# Patient Record
Sex: Female | Born: 1995 | Race: Black or African American | Hispanic: No | Marital: Single
Health system: Midwestern US, Community
[De-identification: ages and names within clinical notes are randomized; demographics above are authoritative.]

## PROBLEM LIST (undated history)

## (undated) DIAGNOSIS — IMO0001 Reserved for inherently not codable concepts without codable children: Secondary | ICD-10-CM

## (undated) DIAGNOSIS — E669 Obesity, unspecified: Secondary | ICD-10-CM

## (undated) DIAGNOSIS — E119 Type 2 diabetes mellitus without complications: Secondary | ICD-10-CM

## (undated) DIAGNOSIS — E109 Type 1 diabetes mellitus without complications: Secondary | ICD-10-CM

## (undated) DIAGNOSIS — N920 Excessive and frequent menstruation with regular cycle: Secondary | ICD-10-CM

## (undated) HISTORY — DX: Type 2 diabetes mellitus without complications: E11.9

## (undated) HISTORY — DX: Excessive and frequent menstruation with regular cycle: N92.0

## (undated) HISTORY — PX: DENTAL SURGERY: SHX609

## (undated) HISTORY — DX: Obesity, unspecified: E66.9

## (undated) HISTORY — DX: Type 1 diabetes mellitus without complications: E10.9

---
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
## 2015-06-08 ENCOUNTER — Encounter (HOSPITAL_COMMUNITY): Payer: Self-pay | Admitting: *Deleted

## 2015-06-08 ENCOUNTER — Emergency Department (INDEPENDENT_AMBULATORY_CARE_PROVIDER_SITE_OTHER)
Admission: EM | Admit: 2015-06-08 | Discharge: 2015-06-08 | Disposition: A | Discharge status: Home / Self Care | Payer: Medicaid Other | Source: Home / Self Care | Attending: Family Medicine | Admitting: Family Medicine

## 2015-06-08 DIAGNOSIS — L309 Dermatitis, unspecified: Secondary | ICD-10-CM

## 2015-06-08 MED ORDER — TRIAMCINOLONE ACETONIDE 0.025 % EX OINT: 1.0000 "application " | TOPICAL_OINTMENT | Freq: Two times a day (BID) | CUTANEOUS | Status: AC

## 2015-06-08 NOTE — ED Notes (Signed)
Pt    reports  Both hands   Are   Dry  And  Peeling         X  3  Weeks  - no  Known  Causative   Agents      Except  Perhaps    Using a  New  Soap   detergent

## 2015-06-08 NOTE — ED Provider Notes (Signed)
CSN: [id number]     Arrival date & time 06/08/15  1513 History   First MD Initiated Contact with Patient 06/08/15 1749     Chief Complaint  Patient presents with  . Hand Problem   (Consider location/radiation/quality/duration/timing/severity/associated sxs/prior Treatment) Patient is a 19 y.o. female presenting with rash. The history is provided by the patient.  Rash Location:  Hand Hand rash location:  R palm and L palm Quality: dryness, itchiness and peeling   Severity:  Moderate Onset quality:  Gradual Progression:  Unchanged Chronicity:  New Context: new detergent/soap   Relieved by:  None tried Worsened by:  Nothing tried Ineffective treatments:  None tried Associated symptoms: no throat swelling and no tongue swelling     Past Medical History  Diagnosis Date  . Asthma   . Diabetes mellitus type I (HCC)   . Diabetes mellitus type II   . Menorrhagia with regular cycle   . Obesity    Past Surgical History  Procedure Laterality Date  . Dental surgery     Family History  Problem Relation Age of Onset  . Obesity Mother   . Hypertension Maternal Grandmother   . Cancer Maternal Grandmother   . Diabetes Maternal Grandfather   . Kidney disease Maternal Grandfather   . Obesity Maternal Grandfather   . Diabetes Paternal Grandmother   . Obesity Paternal Grandmother   . Obesity Father   . Obesity Sister   . Obesity Brother   . Diabetes Maternal Aunt   . Obesity Maternal Aunt   . Diabetes Paternal Aunt    Social History  Substance Use Topics  . Smoking status: Passive Smoke Exposure - Never Smoker  . Smokeless tobacco: None  . Alcohol Use: No   OB History    No data available     Review of Systems  Musculoskeletal: Negative.   Skin: Positive for rash.  All other systems reviewed and are negative.   Allergies  Strawberry extract  Home Medications   Prior to Admission medications   Medication Sig Start Date End Date Taking? Authorizing Provider   ACCU-CHEK FASTCLIX LANCETS MISC 1 each by Does not apply route as directed. Check sugar 6 x daily 02/07/12   Dessa Phi, MD  albuterol (PROVENTIL HFA;VENTOLIN HFA) 108 (90 BASE) MCG/ACT inhaler Inhale 2 puffs into the lungs every 6 (six) hours as needed for wheezing or shortness of breath. 08/22/11   Adlih Moreno-Coll, MD  albuterol (PROVENTIL HFA;VENTOLIN HFA) 108 (90 BASE) MCG/ACT inhaler Inhale 1-2 puffs into the lungs every 6 (six) hours as needed for wheezing. 12/01/13   Riki Sheer, PA-C  albuterol (PROVENTIL) (2.5 MG/3ML) 0.083% nebulizer solution Take 3 mLs (2.5 mg total) by nebulization every 6 (six) hours as needed for wheezing or shortness of breath. 12/01/13   Riki Sheer, PA-C  amoxicillin-clavulanate (AUGMENTIN) 875-125 MG per tablet Take 1 tablet by mouth 2 (two) times daily. One po bid x 7 days 12/14/14   Mercedes Camprubi-Soms, PA-C  azithromycin (ZITHROMAX) 250 MG tablet Take 1 tablet (250 mg total) by mouth daily. Take first 2 tablets together, then 1 every day until finished. 07/14/14   Marissa Sciacca, PA-C  beclomethasone (QVAR) 40 MCG/ACT inhaler Inhale 2 puffs into the lungs 2 (two) times daily.    Historical Provider, MD  cephALEXin (KEFLEX) 500 MG capsule Take 1 capsule (500 mg total) by mouth 4 (four) times daily. Patient not taking: Reported on 07/14/2014 05/06/13   Cathlyn Parsons, NP  fluconazole (DIFLUCAN) 150  MG tablet Take 1 tablet (150 mg total) by mouth once. Repeat in 1 week. Patient not taking: Reported on 07/14/2014 11/29/13   Linna HoffJames D Kindl, MD  glucagon 1 MG injection Use for Severe Hypoglycemia . Inject 1 mg intramuscularly if unresponsive, unable to swallow, unconscious and/or has seizure 02/07/12 02/06/13  Dessa PhiJennifer Badik, MD  ibuprofen (ADVIL,MOTRIN) 200 MG tablet Take 200 mg by mouth every 6 (six) hours as needed. 02/20/12   Historical Provider, MD  insulin aspart (NOVOLOG FLEXPEN) 100 UNIT/ML injection Up to 50 units daily as directed by MD 02/07/12 02/06/13   Dessa PhiJennifer Badik, MD  insulin aspart (NOVOLOG) 100 UNIT/ML injection Inject 5-20 Units into the skin 3 (three) times daily before meals. Sliding scale    Historical Provider, MD  insulin glargine (LANTUS SOLOSTAR) 100 UNIT/ML injection Up to 50 units per day as directed by MD 02/07/12 02/06/13  Dessa PhiJennifer Badik, MD  insulin glargine (LANTUS) 100 UNIT/ML injection Inject 40 Units into the skin at bedtime.    Historical Provider, MD  loratadine (CLARITIN) 10 MG tablet Take 10 mg by mouth daily.    Historical Provider, MD  metFORMIN (GLUCOPHAGE) 500 MG tablet Take 1 tablet (500 mg total) by mouth 2 (two) times daily with a meal. 02/07/12 02/06/13  Dessa PhiJennifer Badik, MD  metFORMIN (GLUCOPHAGE) 500 MG tablet Take 500 mg by mouth 2 (two) times daily with a meal.    Historical Provider, MD  montelukast (SINGULAIR) 10 MG tablet Take 1 tablet (10 mg total) by mouth at bedtime. 08/22/11   Adlih Moreno-Coll, MD  nystatin cream (MYCOSTATIN) Apply topically 2 (two) times daily. Patient not taking: Reported on 07/14/2014 05/06/13   Cathlyn ParsonsAngela M Kabbe, NP  predniSONE (DELTASONE) 10 MG tablet Take 2 tablets (20 mg total) by mouth daily. Patient not taking: Reported on 07/14/2014 05/06/13   Cathlyn ParsonsAngela M Kabbe, NP  predniSONE (DELTASONE) 20 MG tablet 2 tabs po once daily x 3 days Patient not taking: Reported on 07/14/2014 04/04/13   Hayden Rasmussenavid Mabe, NP  terconazole (TERAZOL 3) 80 MG vaginal suppository Place 1 suppository (80 mg total) vaginally at bedtime. Patient not taking: Reported on 07/14/2014 11/29/13   Linna HoffJames D Kindl, MD  triamcinolone (KENALOG) 0.025 % ointment Apply 1 application topically 2 (two) times daily. 06/08/15   Linna HoffJames D Kindl, MD   Meds Ordered and Administered this Visit  Medications - No data to display  BP 132/86 mmHg  Pulse 82  Temp(Src) 98 F (36.7 C) (Oral)  Resp 20  SpO2 97%  LMP 06/01/2015 No data found.   Physical Exam  Constitutional: She is oriented to person, place, and time. She appears well-developed and  well-nourished.  Musculoskeletal: Normal range of motion.  Neurological: She is alert and oriented to person, place, and time.  Skin: Rash noted.  Dry itchy symmetric palmar and plantar dermatitis bilat, no erythema or sign of infection  Nursing note and vitals reviewed.   ED Course  Procedures (including critical care time)  Labs Review Labs Reviewed - No data to display  Imaging Review No results found.   Visual Acuity Review  Right Eye Distance:   Left Eye Distance:   Bilateral Distance:    Right Eye Near:   Left Eye Near:    Bilateral Near:         MDM   1. Dermatitis, eczematoid       Linna HoffJames D Kindl, MD 06/08/15 [phone]

## 2015-06-20 ENCOUNTER — Encounter (HOSPITAL_COMMUNITY): Payer: Self-pay | Admitting: *Deleted

## 2015-06-20 ENCOUNTER — Emergency Department (HOSPITAL_COMMUNITY): Payer: Medicaid Other

## 2015-06-20 ENCOUNTER — Inpatient Hospital Stay (HOSPITAL_COMMUNITY)
Admission: EM | Admit: 2015-06-20 | Discharge: 2015-06-25 | DRG: 637 | Disposition: A | Discharge status: Home / Self Care | Payer: Medicaid Other | Attending: Family Medicine | Admitting: Family Medicine

## 2015-06-20 DIAGNOSIS — A528 Late syphilis, latent: Secondary | ICD-10-CM | POA: Diagnosis present

## 2015-06-20 DIAGNOSIS — R000 Tachycardia, unspecified: Secondary | ICD-10-CM | POA: Insufficient documentation

## 2015-06-20 DIAGNOSIS — E871 Hypo-osmolality and hyponatremia: Secondary | ICD-10-CM | POA: Diagnosis present

## 2015-06-20 DIAGNOSIS — R768 Other specified abnormal immunological findings in serum: Secondary | ICD-10-CM | POA: Diagnosis present

## 2015-06-20 DIAGNOSIS — A4151 Sepsis due to Escherichia coli [E. coli]: Secondary | ICD-10-CM | POA: Diagnosis present

## 2015-06-20 DIAGNOSIS — E869 Volume depletion, unspecified: Secondary | ICD-10-CM | POA: Diagnosis present

## 2015-06-20 DIAGNOSIS — B9620 Unspecified Escherichia coli [E. coli] as the cause of diseases classified elsewhere: Secondary | ICD-10-CM | POA: Diagnosis present

## 2015-06-20 DIAGNOSIS — E1142 Type 2 diabetes mellitus with diabetic polyneuropathy: Secondary | ICD-10-CM | POA: Diagnosis present

## 2015-06-20 DIAGNOSIS — Z1611 Resistance to penicillins: Secondary | ICD-10-CM | POA: Diagnosis present

## 2015-06-20 DIAGNOSIS — Z91048 Other nonmedicinal substance allergy status: Secondary | ICD-10-CM | POA: Diagnosis not present

## 2015-06-20 DIAGNOSIS — R7881 Bacteremia: Secondary | ICD-10-CM | POA: Diagnosis not present

## 2015-06-20 DIAGNOSIS — R0602 Shortness of breath: Secondary | ICD-10-CM | POA: Insufficient documentation

## 2015-06-20 DIAGNOSIS — E860 Dehydration: Secondary | ICD-10-CM | POA: Diagnosis present

## 2015-06-20 DIAGNOSIS — E1165 Type 2 diabetes mellitus with hyperglycemia: Secondary | ICD-10-CM | POA: Diagnosis present

## 2015-06-20 DIAGNOSIS — N179 Acute kidney failure, unspecified: Secondary | ICD-10-CM | POA: Diagnosis present

## 2015-06-20 DIAGNOSIS — R112 Nausea with vomiting, unspecified: Secondary | ICD-10-CM | POA: Diagnosis present

## 2015-06-20 DIAGNOSIS — Z794 Long term (current) use of insulin: Secondary | ICD-10-CM

## 2015-06-20 DIAGNOSIS — Z91128 Patient's intentional underdosing of medication regimen for other reason: Secondary | ICD-10-CM | POA: Diagnosis not present

## 2015-06-20 DIAGNOSIS — G4730 Sleep apnea, unspecified: Secondary | ICD-10-CM | POA: Diagnosis present

## 2015-06-20 DIAGNOSIS — Z91030 Bee allergy status: Secondary | ICD-10-CM | POA: Diagnosis not present

## 2015-06-20 DIAGNOSIS — Z833 Family history of diabetes mellitus: Secondary | ICD-10-CM

## 2015-06-20 DIAGNOSIS — Z7722 Contact with and (suspected) exposure to environmental tobacco smoke (acute) (chronic): Secondary | ICD-10-CM | POA: Diagnosis present

## 2015-06-20 DIAGNOSIS — A539 Syphilis, unspecified: Secondary | ICD-10-CM

## 2015-06-20 DIAGNOSIS — T383X6A Underdosing of insulin and oral hypoglycemic [antidiabetic] drugs, initial encounter: Secondary | ICD-10-CM | POA: Diagnosis present

## 2015-06-20 DIAGNOSIS — N12 Tubulo-interstitial nephritis, not specified as acute or chronic: Secondary | ICD-10-CM | POA: Diagnosis present

## 2015-06-20 DIAGNOSIS — R1084 Generalized abdominal pain: Secondary | ICD-10-CM | POA: Diagnosis present

## 2015-06-20 DIAGNOSIS — E1310 Other specified diabetes mellitus with ketoacidosis without coma: Secondary | ICD-10-CM

## 2015-06-20 DIAGNOSIS — R197 Diarrhea, unspecified: Secondary | ICD-10-CM | POA: Diagnosis present

## 2015-06-20 DIAGNOSIS — Z6841 Body Mass Index (BMI) 40.0 and over, adult: Secondary | ICD-10-CM

## 2015-06-20 DIAGNOSIS — Z7951 Long term (current) use of inhaled steroids: Secondary | ICD-10-CM

## 2015-06-20 DIAGNOSIS — R079 Chest pain, unspecified: Secondary | ICD-10-CM | POA: Diagnosis not present

## 2015-06-20 DIAGNOSIS — J4540 Moderate persistent asthma, uncomplicated: Secondary | ICD-10-CM | POA: Diagnosis present

## 2015-06-20 DIAGNOSIS — Z9114 Patient's other noncompliance with medication regimen: Secondary | ICD-10-CM

## 2015-06-20 DIAGNOSIS — I471 Supraventricular tachycardia: Secondary | ICD-10-CM | POA: Diagnosis present

## 2015-06-20 DIAGNOSIS — IMO0002 Reserved for concepts with insufficient information to code with codable children: Secondary | ICD-10-CM | POA: Diagnosis present

## 2015-06-20 DIAGNOSIS — D649 Anemia, unspecified: Secondary | ICD-10-CM | POA: Diagnosis present

## 2015-06-20 DIAGNOSIS — E1010 Type 1 diabetes mellitus with ketoacidosis without coma: Secondary | ICD-10-CM | POA: Diagnosis not present

## 2015-06-20 DIAGNOSIS — E1110 Type 2 diabetes mellitus with ketoacidosis without coma: Secondary | ICD-10-CM | POA: Diagnosis present

## 2015-06-20 DIAGNOSIS — E1169 Type 2 diabetes mellitus with other specified complication: Principal | ICD-10-CM | POA: Diagnosis present

## 2015-06-20 LAB — BETA-HYDROXYBUTYRIC ACID: BETA-HYDROXYBUTYRIC ACID: 5.06 mmol/L — AB (ref 0.05–0.27)

## 2015-06-20 LAB — COMPREHENSIVE METABOLIC PANEL
ALK PHOS: 101 U/L (ref 38–126)
ALT: 15 U/L (ref 14–54)
ANION GAP: 17 — AB (ref 5–15)
AST: 20 U/L (ref 15–41)
Albumin: 2.9 g/dL — ABNORMAL LOW (ref 3.5–5.0)
BUN: 18 mg/dL (ref 6–20)
CO2: 16 mmol/L — ABNORMAL LOW (ref 22–32)
Calcium: 9 mg/dL (ref 8.9–10.3)
Chloride: 89 mmol/L — ABNORMAL LOW (ref 101–111)
Creatinine, Ser: 1.54 mg/dL — ABNORMAL HIGH (ref 0.44–1.00)
GFR calc non Af Amer: 48 mL/min — ABNORMAL LOW (ref >60)
GFR, EST AFRICAN AMERICAN: 56 mL/min — AB (ref >60)
Glucose, Bld: 571 mg/dL (ref 65–99)
Potassium: 5.1 mmol/L (ref 3.5–5.1)
Sodium: 122 mmol/L — ABNORMAL LOW (ref 135–145)
TOTAL PROTEIN: 8.4 g/dL — AB (ref 6.5–8.1)
Total Bilirubin: 1.2 mg/dL (ref 0.3–1.2)

## 2015-06-20 LAB — URINALYSIS, ROUTINE W REFLEX MICROSCOPIC
Glucose, UA: >1000 mg/dL — AB
LEUKOCYTES UA: NEGATIVE
Nitrite: NEGATIVE
PROTEIN: 100 mg/dL — AB
Specific Gravity, Urine: 1.033 — ABNORMAL HIGH (ref 1.005–1.030)
Urobilinogen, UA: 1 mg/dL (ref 0.0–1.0)
pH: 5 (ref 5.0–8.0)

## 2015-06-20 LAB — BASIC METABOLIC PANEL
ANION GAP: 10 (ref 5–15)
BUN: 14 mg/dL (ref 6–20)
CHLORIDE: 101 mmol/L (ref 101–111)
CO2: 19 mmol/L — ABNORMAL LOW (ref 22–32)
Calcium: 7.8 mg/dL — ABNORMAL LOW (ref 8.9–10.3)
Creatinine, Ser: 1.07 mg/dL — ABNORMAL HIGH (ref 0.44–1.00)
GFR calc non Af Amer: >60 mL/min (ref >60)
Glucose, Bld: 301 mg/dL — ABNORMAL HIGH (ref 65–99)
POTASSIUM: 4.3 mmol/L (ref 3.5–5.1)
SODIUM: 130 mmol/L — AB (ref 135–145)

## 2015-06-20 LAB — URINE MICROSCOPIC-ADD ON

## 2015-06-20 LAB — I-STAT VENOUS BLOOD GAS, ED
Acid-base deficit: 11 mmol/L — ABNORMAL HIGH (ref 0.0–2.0)
Bicarbonate: 17 mEq/L — ABNORMAL LOW (ref 20.0–24.0)
O2 SAT: 51 %
PCO2 VEN: 45.6 mmHg (ref 45.0–50.0)
PO2 VEN: 34 mmHg (ref 30.0–45.0)
TCO2: 18 mmol/L (ref 0–100)
pH, Ven: 7.179 — CL (ref 7.250–7.300)

## 2015-06-20 LAB — I-STAT BETA HCG BLOOD, ED (MC, WL, AP ONLY)

## 2015-06-20 LAB — CBC
HCT: 37.3 % (ref 36.0–46.0)
HEMOGLOBIN: 12.1 g/dL (ref 12.0–15.0)
MCH: 24.6 pg — ABNORMAL LOW (ref 26.0–34.0)
MCHC: 32.4 g/dL (ref 30.0–36.0)
MCV: 76 fL — ABNORMAL LOW (ref 78.0–100.0)
PLATELETS: 274 10*3/uL (ref 150–400)
RBC: 4.91 MIL/uL (ref 3.87–5.11)
RDW: 13.4 % (ref 11.5–15.5)
WBC: 17.3 10*3/uL — ABNORMAL HIGH (ref 4.0–10.5)

## 2015-06-20 LAB — LIPASE, BLOOD: Lipase: 41 U/L (ref 11–51)

## 2015-06-20 LAB — CBG MONITORING, ED
Glucose-Capillary: 475 mg/dL — ABNORMAL HIGH (ref 65–99)
Glucose-Capillary: 457 mg/dL — ABNORMAL HIGH (ref 65–99)
Glucose-Capillary: 403 mg/dL — ABNORMAL HIGH (ref 65–99)
Glucose-Capillary: 342 mg/dL — ABNORMAL HIGH (ref 65–99)

## 2015-06-20 LAB — PREGNANCY, URINE: Preg Test, Ur: NEGATIVE

## 2015-06-20 LAB — GLUCOSE, CAPILLARY: GLUCOSE-CAPILLARY: 245 mg/dL — AB (ref 65–99)

## 2015-06-20 MED ORDER — DILTIAZEM LOAD VIA INFUSION
10.0000 mg | Freq: Once | INTRAVENOUS | Status: AC
  Administered 2015-06-20: 10 mg via INTRAVENOUS
  Filled 2015-06-20: qty 10

## 2015-06-20 MED ORDER — DILTIAZEM HCL 100 MG IV SOLR
5.0000 mg/h | INTRAVENOUS | Status: AC
  Administered 2015-06-20: 5 mg/h via INTRAVENOUS

## 2015-06-20 MED ORDER — BUDESONIDE 0.25 MG/2ML IN SUSP
0.2500 mg | Freq: Two times a day (BID) | RESPIRATORY_TRACT | Status: DC
  Administered 2015-06-21 – 2015-06-25 (×9): 0.25 mg via RESPIRATORY_TRACT
  Filled 2015-06-20 (×10): qty 2

## 2015-06-20 MED ORDER — ONDANSETRON HCL 4 MG PO TABS
4.0000 mg | ORAL_TABLET | Freq: Four times a day (QID) | ORAL | Status: DC | PRN
Start: 2015-06-20 — End: 2015-06-22
  Administered 2015-06-21 – 2015-06-22 (×2): 4 mg via ORAL
  Filled 2015-06-20 (×2): qty 1

## 2015-06-20 MED ORDER — ALBUTEROL SULFATE (2.5 MG/3ML) 0.083% IN NEBU
2.5000 mg | INHALATION_SOLUTION | RESPIRATORY_TRACT | Status: DC | PRN
  Administered 2015-06-22 (×2): 2.5 mg via RESPIRATORY_TRACT
  Filled 2015-06-20 (×3): qty 3

## 2015-06-20 MED ORDER — ACETAMINOPHEN 325 MG PO TABS
650.0000 mg | ORAL_TABLET | Freq: Once | ORAL | Status: AC
  Administered 2015-06-20: 650 mg via ORAL
  Filled 2015-06-20: qty 2

## 2015-06-20 MED ORDER — DEXTROSE-NACL 5-0.45 % IV SOLN
INTRAVENOUS | Status: DC
  Administered 2015-06-20 – 2015-06-21 (×2): via INTRAVENOUS

## 2015-06-20 MED ORDER — ADENOSINE 6 MG/2ML IV SOLN
6.0000 mg | Freq: Once | INTRAVENOUS | Status: DC
  Filled 2015-06-20: qty 2

## 2015-06-20 MED ORDER — DILTIAZEM HCL 100 MG IV SOLR
INTRAVENOUS | Status: AC
  Filled 2015-06-20: qty 100

## 2015-06-20 MED ORDER — SODIUM CHLORIDE 0.9 % IV BOLUS (SEPSIS)
1000.0000 mL | Freq: Once | INTRAVENOUS | Status: AC
  Administered 2015-06-20: 1000 mL via INTRAVENOUS

## 2015-06-20 MED ORDER — ENOXAPARIN SODIUM 40 MG/0.4ML ~~LOC~~ SOLN
40.0000 mg | SUBCUTANEOUS | Status: DC
  Administered 2015-06-21 – 2015-06-25 (×5): 40 mg via SUBCUTANEOUS
  Filled 2015-06-20 (×6): qty 0.4

## 2015-06-20 MED ORDER — ONDANSETRON 4 MG PO TBDP
ORAL_TABLET | ORAL | Status: AC
  Filled 2015-06-20: qty 1

## 2015-06-20 MED ORDER — POTASSIUM CHLORIDE 10 MEQ/100ML IV SOLN
10.0000 meq | INTRAVENOUS | Status: AC
  Administered 2015-06-20 – 2015-06-21 (×2): 10 meq via INTRAVENOUS
  Filled 2015-06-20 (×2): qty 100

## 2015-06-20 MED ORDER — SODIUM CHLORIDE 0.9 % IV SOLN
INTRAVENOUS | Status: AC
Start: 2015-06-20 — End: 2015-06-21
  Administered 2015-06-20: 22:00:00 via INTRAVENOUS

## 2015-06-20 MED ORDER — ACETAMINOPHEN 325 MG PO TABS
650.0000 mg | ORAL_TABLET | Freq: Four times a day (QID) | ORAL | Status: DC | PRN
  Administered 2015-06-20 – 2015-06-23 (×8): 650 mg via ORAL
  Filled 2015-06-20 (×8): qty 2

## 2015-06-20 MED ORDER — ONDANSETRON HCL 4 MG/2ML IJ SOLN
4.0000 mg | Freq: Three times a day (TID) | INTRAMUSCULAR | Status: DC | PRN
  Filled 2015-06-20: qty 2

## 2015-06-20 MED ORDER — SODIUM CHLORIDE 0.9 % IV SOLN
INTRAVENOUS | Status: DC
  Administered 2015-06-20 – 2015-06-24 (×6): via INTRAVENOUS

## 2015-06-20 MED ORDER — MONTELUKAST SODIUM 10 MG PO TABS
10.0000 mg | ORAL_TABLET | Freq: Every day | ORAL | Status: DC
  Administered 2015-06-20 – 2015-06-24 (×4): 10 mg via ORAL
  Filled 2015-06-20 (×4): qty 1

## 2015-06-20 MED ORDER — ONDANSETRON 4 MG PO TBDP
4.0000 mg | ORAL_TABLET | Freq: Once | ORAL | Status: AC | PRN
  Administered 2015-06-20: 4 mg via ORAL

## 2015-06-20 MED ORDER — SODIUM CHLORIDE 0.9 % IV SOLN
INTRAVENOUS | Status: DC
  Administered 2015-06-20: 4 [IU]/h via INTRAVENOUS
  Filled 2015-06-20: qty 2.5

## 2015-06-20 MED ORDER — SODIUM CHLORIDE 0.9 % IV SOLN
1000.0000 mL | Freq: Once | INTRAVENOUS | Status: AC
  Administered 2015-06-20: 1000 mL via INTRAVENOUS

## 2015-06-20 MED ORDER — BECLOMETHASONE DIPROPIONATE 40 MCG/ACT IN AERS: 2.0000 | INHALATION_SPRAY | Freq: Two times a day (BID) | RESPIRATORY_TRACT | Status: DC

## 2015-06-20 MED ORDER — MORPHINE SULFATE (PF) 2 MG/ML IV SOLN
2.0000 mg | INTRAVENOUS | Status: DC | PRN
  Filled 2015-06-20: qty 1

## 2015-06-20 NOTE — Discharge Summary (Signed)
Family Medicine Teaching [facility] Discharge Summary  Patient name: Brandi Robertson Medical record number: [id number] Date of birth: 1996/01/01 Age: 19 y.o. Gender: female Date of Admission: 06/20/2015  Date of Discharge: 06/25/15 Admitting Physician: Doreene Eland, MD  Primary Care Provider: [facility] Consultants: Cardiology  Indication for Hospitalization: DKA  Discharge Diagnoses/Problem List:  Principal Problem:   DKA, mild-moderate, in setting of uncontrolled insulin dependent Type 2 DM) Active Problems:   Type 2 diabetes mellitus, uncontrolled (HCC)   Morbid obesity (HCC)   Diabetic peripheral neuropathy (HCC)   Nausea vomiting and diarrhea   Generalized abdominal pain   Asthma, moderate persistent   Bacteremia   Sinus tachycardia (HCC)   SOB (shortness of breath)   Syphilis   AKI, presumed pre-renal from mod-dehydration, secondary to DKA  Disposition: Home  Discharge Condition: Stable  Discharge Exam:  Blood pressure 134/88, pulse 78, temperature 98.4 F (36.9 C), temperature source Oral, resp. rate 21, height  (1.626 m), weight 248 lb 4.8 oz (112.628 kg), last menstrual period 06/01/2015, SpO2 100 %.  General: Obese, well-appearing female laying in bed Cardiac: RRR, S1, S2, no m/r/g Pulm: CTAB, no wheezes Abdomen: slight ttp of left lower quadrant Skin: multiple hypopigmented lesions and dry scaling on soles of feet and palms of hands  Brief Hospital Course:   Brandi Robertson is a 19 y.o. Female (PMH T2DM (insulin dependent), peripheral neuropathy DM, morbid obesity) who presented to ED on 11/12 with nausea / vomiting, generalized abdominal pain for 2 days found to be in mild-moderate DKA. Suspected DKA was triggered from non-adherence of all insulin for >1 year, lost to follow-up from Endocrine >2 years. No recent illness or history to suggest infectious trigger, seems symptoms acute onset with general GI complaints with  DKA.  On arrival to ED patient with stable mental status, found to have CBG to 550s, AG 17, bicarb 16. ED initiated DKA protocol with IVF resuscitation, started on insulin drip. On admission, patient was hemodynamically stable, without active vomiting and tolerating small sips PO. She remained tachycardic in 130s-140s, low-grade fever to 101F. Further work-up without identified infectious etiology, cultures collected.  Patient was admitted to SDU, and after arrival to floor, patient proceeded to develop significant sinus tachycardia up to HR 180s, this was sustained for at least an hour, with some complaint of chest tightness. Subsequent EKG suggested sinus tach and not SVT. Trial of diltiazem IV drip and bolus started with only mild improvement. Troponins negative. Cardiology fellow on-call suspected 19 yr old stress response with DKA / fever could have HR up to 180, recommended Metoprolol IV PRN, with improvement. Metoprolol continued PO for 3 days. Patient's tachycardia had resolved on day of discharge off of metoprolol.   During hospitalization, patient's anion gap closed over first 6 hours, but continued insulin drip to ensure it remained stable and clinical improvement until the following day. Transitioned to SQ insulin with Lantus 25 and SSI. Lantus up-titrated over hospitalization to 53 units daily.   Initial blood culture resulted as growing E. coli sensitive to cephalosporins but penicillin resistant. Bacteremia suspected to be due to pyelonephritis given growth of gram negative rods. Patient was treated with IV zosyn x 3 days then received 2 doses of ancef, then switched to Keflex 500 mg q6h x 7 days.  HIV testing was negative, but patient was positive for syphilis by both quant and reflex antibody testing. Separate fluorescent treponemal IgG antibody testing also positive. The health department  interviewed patient in the hospital. Patient received an injection of IM penicillin g benzathine 2.4  million units on 06/24/15. 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 2nd dose scheduled for 07/01/15 at the health department.   Patient's diabetes uncontrolled with a HgbA1c of 12.9 and no treatment with insulin for years. Simplified hospital regimen upon discharge to 53 units of lantus daily without SSI and restarting metformin 500 mg BID.   Patient's mother agreed to try to help patient keep up with her insulin and complete antibiotics course for suspected pyelonephritis and known syphilis.   Issues for Follow Up:  1. 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  2. Confirm that patient has been able to get glucometer. 3. Assess control of blood sugars on lantus 53 units and metformin 500 mg BID.  4. Consider adding once-a-week GLP-1 agonist like tanzeum to regimen, if patient able to adhere to current insulin regimen, for better control.  5. Recheck CBC for anemia, hbg 9.3 on discharge. Appears to be microcytic, iron-deficiency anemia with low iron of 10. However, TIBC was low and ferritin high. Reticulocytes only 0.6%. Folate wnl, B12 high.   Significant Procedures: none  Significant Labs and Imaging:   Recent Labs Lab 06/23/15 1620 06/24/15 0324 06/25/15 1455  WBC 10.3 11.1* 10.5  HGB 8.5* 9.2* 9.3*  HCT 26.0* 27.7* 27.6*  PLT 222 240 271    Recent Labs Lab 06/20/15 1513  06/21/15  06/21/15 1230 06/22/15 0455 06/23/15 1620 06/24/15 0324 06/25/15 1117  NA 122*  < > 128*  < > 129* 129* 134* 139 145  K 5.1  < > 4.3  < > 3.9 3.7 3.2* 3.5 4.4  CL 89*  < > 99*  < > 105 104 110 112* 114*  CO2 16*  < > 17*  < > 17* 17* 19* 20* 20*  GLUCOSE 571*  < > 299*  < > 138* 309* 196* 224* 189*  BUN 18  < > 14  < > <5*  CREATININE 1.54*  < > 1.09*  < > 0.97 1.04* 0.89 0.83 0.70  CALCIUM 9.0  < > 7.9*  < > 7.4* 7.4* 7.0* 7.3* 8.4*  MG  --   --  2.0  --   --    --   --   --   --   ALKPHOS 101  --   --   --   --   --   --   --   --   AST 20  --   --   --   --   --   --   --   --   ALT 15  --   --   --   --   --   --   --   --   ALBUMIN 2.9*  --   --   --   --   --   --   --   --   < > = values in this interval not displayed.  Beta-hydroxybutyric acid - 5.06 (high)  VBG pH 7.179 (low), pCO2 45.6, pO2 34, bicarb 17  Lipase 41  UA - cloudy, rare bacteria, moderate bili, granular cast, glucose >1000, large hgb, >80 ketones, neg leuks, neg nitrite, protein 100, RBC 11-20, Spec grav 1.033 (elevated), WBC 7-10  Urine Pregnancy - negative  Urine Culture - multiple  species present  Blood Cultures x 2 - (11/12>>) positive for E. coli; repeat culture drawn 11/13 showed no growth x 5 days  06/22/15 - RPR, quant: 1:128 (H) 06/22/15 - T pallidum Abs: Positive 06/23/15 - Fluorescent treponemal Ab, IgG: Reactive  11/12 CXR Portable 1v FINDINGS: Heart size and mediastinal contours are within normal limits. Both lungs are clear. Visualized skeletal structures are unremarkable. IMPRESSION: Negative exam.  11/12 ED EKG Sinus tachycardia with HR 147, with some repolarization in inferior leads. No evidence of acute ST-T wave ischemic changes.   Results/Tests Pending at Time of Discharge: None  Discharge Medications:    Medication List    STOP taking these medications        insulin aspart 100 UNIT/ML injection  Commonly known as:  NOVOLOG FLEXPEN      TAKE these medications        accu-chek multiclix lancets  Use as instructed     albuterol 108 (90 BASE) MCG/ACT inhaler  Commonly known as:  PROVENTIL HFA;VENTOLIN HFA  Inhale 2 puffs into the lungs every 6 (six) hours as needed for wheezing or shortness of breath.     albuterol (2.5 MG/3ML) 0.083% nebulizer solution  Commonly known as:  PROVENTIL  Take 3 mLs (2.5 mg total) by nebulization every 6 (six) hours as needed for wheezing or shortness of breath.     beclomethasone 40 MCG/ACT  inhaler  Commonly known as:  QVAR  Inhale 2 puffs into the lungs 2 (two) times daily.     cephALEXin 500 MG capsule  Commonly known as:  KEFLEX  Take 1 capsule (500 mg total) by mouth every 6 (six) hours.     glucagon 1 MG injection  Use for Severe Hypoglycemia . Inject 1 mg intramuscularly if unresponsive, unable to swallow, unconscious and/or has seizure     ibuprofen 200 MG tablet  Commonly known as:  ADVIL,MOTRIN  Take 200 mg by mouth every 6 (six) hours as needed.     insulin glargine 100 UNIT/ML injection  Commonly known as:  LANTUS  Inject 0.53 mLs (53 Units total) into the skin daily.     loratadine 10 MG tablet  Commonly known as:  CLARITIN  Take 10 mg by mouth daily.     metFORMIN 500 MG tablet  Commonly known as:  GLUCOPHAGE  Take 1 tablet (500 mg total) by mouth 2 (two) times daily with a meal.     montelukast 10 MG tablet  Commonly known as:  SINGULAIR  Take 1 tablet (10 mg total) by mouth at bedtime.     triamcinolone 0.025 % ointment  Commonly known as:  KENALOG  Apply 1 application topically 2 (two) times daily.        Discharge Instructions: Please refer to Patient Instructions section of EMR for full details.  Patient was counseled important signs and symptoms that should prompt return to medical care, changes in medications, dietary instructions, activity restrictions, and follow up appointments.   Follow-Up Appointments: Follow-up Information    Follow up with Family Medicine. Go on 06/29/2015.   Why:   2:15 pm hospital follow-up appointment    Contact information:   [address] [phone]       Follow up with [facility]. Go on 07/01/2015.   Why:  10:00 am appointment for penicillin shot to treat syphilis. Please call [phone] if you need to reschedule.   Contact information:   [address] [place] [address] [phone]  Casey BurkittHillary Moen Fitzgerald, MD 06/25/2015, 4:51 PM PGY-1, [facility]

## 2015-06-20 NOTE — ED Notes (Signed)
Pt reports generalized abd pain x 2 days with n/v/d, headache and back pain.

## 2015-06-20 NOTE — ED Notes (Signed)
Admitting at bedside, 2nd RN unable to obtain access, MD aware will consult IV team.

## 2015-06-20 NOTE — ED Notes (Signed)
Unable to obtain 2nd IV, 2nd RN to attempt.

## 2015-06-20 NOTE — H&P (Signed)
Brandi Robertson: [phone]  Patient name: Brandi Robertson Medical record number: [id number] Date of birth: [birth year] Age: 19 y.o. Gender: female  Primary Care Provider: Lenoard Aden, MD Consultants: none Code Status: Full  Chief Complaint: Nausea / Vomiting, Abdominal Pain  Assessment and Plan: Brandi Robertson is a 19 y.o. female presenting with nausea / vomiting, generalized abdominal pain for 2 days found to be in mild-moderate DKA with uncontrolled Insulin Dependent Diabetes. PMH is significant for T2DM (insulin dependent), peripheral neuropathy DM, morbid obesity, mod persistent asthma.  # DKA, mild-moderate, in setting of uncontrolled insulin dependent Type 2 Diabetes Clinically consistent with mild-mod DKA as she is dehydrated on admit with hyperglycemia to 550s, AG 17, low pH VBG, bicarb 16, significant >80 ketones in urine, and positive beta-hydroxybutyric acid. Suspected trigger is non-adherence to insulin therapy (>1 year off lantus, novolog) and no longer following endocrinology (Dr. Tobe Robertson, last 09/2012), chart review shows patient is a T2DM as she has prior c-peptide positive but unable to meet body req of insulin, therefore not Type 1 DM. Possible underlying viral gastroenteritis as contributing trigger, with some nausea, vomiting, diarrhea, abdominal pain, however without sick contacts, low-grade temp in ED but none previous, also elevated WBC 17. On admit clinically well-appearing, normal mental status, and hemodynamically stable. - Admit to SDU, anticipate if AG closed and transitioned to SQ insulin in morning can transfer out of SDU - Continue on Insulin Drip protocol for DKA - S/p 1L NS IV bolus in ED - Continue increased MIVF with NS @ 175cc/hr while CBG >250, then transition to D5 per protocol - Ordered IV K 79mq x 2 runs for K at 5.1, anticipate to decrease with insulin - BMET q 4 hr  overnight, follow AG - Follow AG until closed x 2 and symptoms improved prior to start transition to SQ insulin (on home Lantus 40, would likely start with 20 to 25u first dose, titrate up), keep insulin drip on for 2 hours after SQ Lantus given then discontinue. - Check A1c - Zofran PRN nausea - NPO >> clear liquid diet as tolerated - UA without significant sign of infection, urine culture pending - Blood cultures x 2 pending - Urine pregnancy negative  # AKI, likely pre-renal from mod dehydration, secondary to DKA Clinically dehydrated secondary to above DKA. Elevated SCr to 1.54 from baseline 0.5-0.6. Does take some ibuprofen at home, last took yesterday. - Treat underlying DKA - IVF resuscitation - Anticipate start clear liq diet as soon as tolerated - Hold nephrotoxic meds - (holding metformin, avoid NSAIDs)  # Morbid Obesity Currently 240 lb on admit with BMI 41. T2DM with severe insulin resistance. - Check lipid panel in AM - Check A1c  # Asthma, mod-persistent - without exacerbation Currentle stable. No wheezing. Stable respiratory status. No recent exacerbations. - ordered albuterol neb PRN - resume home singulair, qvar  FEN/GI: NS IVF per protocol and transition to D5 IVF, NPO until adv to diet Prophylaxis: Lovenox SQ  Disposition: Admit to SDU for DKA, on insulin drip protocol and IVF rehydration, close monitoring until AG closed then transition to SQ insulin when tolerating PO. Anticipate 1-3 day hospitalization.  History of Present Illness:  Brandi Escarenois a 19y.o. female presenting with nausea, vomiting, abdominal pain.  She reports symptoms started to gradually worsen about 2 days ago with nausea, vomiting (worse after eating or drinking anything with "flavor" makes nausea worse), generalized abdominal and back pain,  severity of pain is 19/10 and constant, without improvement, tried OTC Ibuprofen without significant relief. - Admits to occasional cough. Denies  any recent sick contacts. - Admits to diarrhea for past 2 days, started after nausea/vomiting - Denies any associated fevers/chills, chest pain, shortness of breath, dysuria, hematuria  Reports history of Diabetes. Takes Lantus 40 units in morning and Novolog SSI about 4 times daily, also Metformin 527m BID. Previously followed by Dr. BTobe Soslast OBovey4/2015. States that she ran out of her insulin and metformin about 1 year ago. Expired glucagon kit at home.  History asthma and allergies, claritin, singulair, qvar 447m 2 puffs BID, albuterol inhaler. Denies any recent history of asthma exacerbation.  Lives at home locally with mother, sister and brother.  Note - mother not in ED with patient, she was accompanied by her sister. I called mother per patient request to provide update on her current hospitalization.  Review Of Systems: Per HPI with the following additions: see above Otherwise the remainder of the systems were negative.  Patient Active Problem List   Diagnosis Date Noted  . Other malaise and fatigue 10/04/2012  . Polyuria 10/04/2012  . Diabetic peripheral neuropathy ([facility]02/27/2014  . Type 2 diabetes mellitus, uncontrolled ([facility] [place]07/09/2011  . Morbid obesity ([facility]07/09/2011  . Menorrhagia with irregular cycle 02/07/2012  . Acanthosis 02/07/2012  . Menorrhagia with regular cycle     Past Medical History: Past Medical History  Diagnosis Date  . Asthma   . Diabetes mellitus type I ([facility]  . Diabetes mellitus type II   . Menorrhagia with regular cycle   . Obesity     Past Surgical History: Past Surgical History  Procedure Laterality Date  . Dental surgery      Social History: Social History  Substance Use Topics  . Smoking status: Passive Smoke Exposure - Never Smoker  . Smokeless tobacco: None  . Alcohol Use: No   Additional social history:  Lives at home with Mother (primary caregiver), older sister, and brother. Not employed. Did not graduate high school,  completed 11th grade. Denies any use of tobacco, alcohol, or illicit drugs.   Please also refer to relevant sections of EMR.  Family History: Family History  Problem Relation Age of Onset  . Obesity Mother   . Hypertension Maternal Grandmother   . Cancer Maternal Grandmother   . Diabetes Maternal Grandfather   . Kidney disease Maternal Grandfather   . Obesity Maternal Grandfather   . Diabetes Paternal Grandmother   . Obesity Paternal Grandmother   . Obesity Father   . Obesity Sister   . Obesity Brother   . Diabetes Maternal Aunt   . Obesity Maternal Aunt   . Diabetes Paternal Aunt     Allergies and Medications: Allergies  Allergen Reactions  . Bee Venom   . Strawberry Extract Hives   No current facility-administered medications on file prior to encounter.   Current Outpatient Prescriptions on File Prior to Encounter  Medication Sig Dispense Refill  . ACCU-CHEK FASTCLIX LANCETS MISC 1 each by Does not apply route as directed. Check sugar 6 x daily 204 each 3  . albuterol (PROVENTIL HFA;VENTOLIN HFA) 108 (90 BASE) MCG/ACT inhaler Inhale 2 puffs into the lungs every 6 (six) hours as needed for wheezing or shortness of breath. 1 Inhaler 0  . albuterol (PROVENTIL HFA;VENTOLIN HFA) 108 (90 BASE) MCG/ACT inhaler Inhale 1-2 puffs into the lungs every 6 (six) hours as needed for wheezing. 1 Inhaler 0  .  albuterol (PROVENTIL) (2.5 MG/3ML) 0.083% nebulizer solution Take 3 mLs (2.5 mg total) by nebulization every 6 (six) hours as needed for wheezing or shortness of breath. 75 mL 12  . amoxicillin-clavulanate (AUGMENTIN) 875-125 MG per tablet Take 1 tablet by mouth 2 (two) times daily. One po bid x 7 days 14 tablet 0  . azithromycin (ZITHROMAX) 250 MG tablet Take 1 tablet (250 mg total) by mouth daily. Take first 2 tablets together, then 1 every day until finished. 6 tablet 0  . beclomethasone (QVAR) 40 MCG/ACT inhaler Inhale 2 puffs into the lungs 2 (two) times daily.    . cephALEXin  (KEFLEX) 500 MG capsule Take 1 capsule (500 mg total) by mouth 4 (four) times daily. (Patient not taking: Reported on 07/14/2014) 28 capsule 0  . fluconazole (DIFLUCAN) 150 MG tablet Take 1 tablet (150 mg total) by mouth once. Repeat in 1 week. (Patient not taking: Reported on 07/14/2014) 1 tablet 1  . glucagon 1 MG injection Use for Severe Hypoglycemia . Inject 1 mg intramuscularly if unresponsive, unable to swallow, unconscious and/or has seizure 2 each 3  . ibuprofen (ADVIL,MOTRIN) 200 MG tablet Take 200 mg by mouth every 6 (six) hours as needed.    . insulin aspart (NOVOLOG FLEXPEN) 100 UNIT/ML injection Up to 50 units daily as directed by MD 5 pen 3  . insulin aspart (NOVOLOG) 100 UNIT/ML injection Inject 5-20 Units into the skin 3 (three) times daily before meals. Sliding scale    . insulin glargine (LANTUS SOLOSTAR) 100 UNIT/ML injection Up to 50 units per day as directed by MD 15 mL 3  . insulin glargine (LANTUS) 100 UNIT/ML injection Inject 40 Units into the skin at bedtime.    Marland Kitchen loratadine (CLARITIN) 10 MG tablet Take 10 mg by mouth daily.    . metFORMIN (GLUCOPHAGE) 500 MG tablet Take 1 tablet (500 mg total) by mouth 2 (two) times daily with a meal. 60 tablet 11  . metFORMIN (GLUCOPHAGE) 500 MG tablet Take 500 mg by mouth 2 (two) times daily with a meal.    . montelukast (SINGULAIR) 10 MG tablet Take 1 tablet (10 mg total) by mouth at bedtime. 30 tablet 0  . nystatin cream (MYCOSTATIN) Apply topically 2 (two) times daily. (Patient not taking: Reported on 07/14/2014) 30 g 0  . predniSONE (DELTASONE) 10 MG tablet Take 2 tablets (20 mg total) by mouth daily. (Patient not taking: Reported on 07/14/2014) 8 tablet 0  . predniSONE (DELTASONE) 20 MG tablet 2 tabs po once daily x 3 days (Patient not taking: Reported on 07/14/2014) 6 tablet 0  . terconazole (TERAZOL 3) 80 MG vaginal suppository Place 1 suppository (80 mg total) vaginally at bedtime. (Patient not taking: Reported on 07/14/2014) 3  suppository 0  . triamcinolone (KENALOG) 0.025 % ointment Apply 1 application topically 2 (two) times daily. 30 g 2    Objective: BP 112/59 mmHg  Pulse 149  Temp(Src) 99.4 F (37.4 C) (Oral)  Resp 23  Ht _0  (1.626 m)  Wt 240 lb 4 oz (108.977 kg)  BMI 41.22 kg/m2  SpO2 97%  LMP 06/01/2015 Exam: General: Morbidly obese, laying in bed, pleasant, cooperative, NAD Eyes: Clear conjunctiva, anicteric sclera, PERRL, EOMI ENTM: nares patent, oropharynx clear, tongue dry with thickened film mucus membranes dry, poor dentition Neck: large, supple, non-tender, full AROM Chest wall - non-tender to palpation Cardiovascular: tachycardic (improved on exam), regular rhythm, no murmurs heard Respiratory: Diminished breath sounds bilaterally due to body habitus and effort,  but mostly CTAB. No focal crackles, or wheezing heard. Good air movement. Speaks full sentences. No tachypnea. Abdomen: Obese, soft, mild generalized tenderness, seems to localize to epigastric and lower abdomen, no rebound or guarding, negative McBurney's MSK: Back no obvious deformity, tenderness to palpation bilateral paraspinal mid to lower lumbar spine with mild muscle spasm, difficult to exam back muscles due to obesity. Distal muscle str 5/5, moves all ext. Skin: warm, dry, notable thickened dry cracking skin on palms R > L, no ulcerations, or rashes Neuro: Awake, alert, oriented (person, place, year), grossly non-focal, CN-II XII grossly intact, mild reduced distal sensation to light touch on Right fingertips only, otherwise distal sensation intact. Psych: Normal speech and thoughts. Some insight into condition but poor medical comprehension.  Labs and Imaging: CBC BMET   Recent Labs Lab 06/20/15 1513  WBC 17.3*  HGB 12.1  HCT 37.3  PLT 274    Recent Labs Lab 06/20/15 1513  NA 122*  K 5.1  CL 89*  CO2 16*  BUN 18  CREATININE 1.54*  GLUCOSE 571*  CALCIUM 9.0     Beta-hydroxybutyric acid - 5.06  (high)  VBG pH 7.179 (low), pCO2 45.6, pO2 34, bicarb 17  Lipase 41  UA - cloudy, rare bacteria, moderate bili, granular cast, glucose >1000, large hgb, >80 ketones, neg leuks, neg nitrite, protein 100, RBC 11-20, Spec grav 1.033 (elevated), WBC 7-10  Urine Pregnancy - negative  Urine Culture - pending  Blood Cultures x 2 - (11/12>>) pending  11/12 CXR Portable 1v FINDINGS: Heart size and mediastinal contours are within normal limits. Both lungs are clear. Visualized skeletal structures are unremarkable. IMPRESSION: Negative exam.  11/12 ED EKG Sinus tachycardia with HR 147, with some repolarization in inferior leads. No evidence of acute ST-T wave ischemic changes.   Olin Hauser, DO 06/20/2015, 5:50 PM PGY-3, [place] Intern Robertson: [phone], text pages welcome

## 2015-06-20 NOTE — ED Provider Notes (Addendum)
CSN: [id number]     Arrival date & time 06/20/15  1426 History   First MD Initiated Contact with Patient 06/20/15 1636     Chief Complaint  Patient presents with  . Abdominal Pain  . Emesis     (Consider location/radiation/quality/duration/timing/severity/associated sxs/prior Treatment) HPI This is a 19 year old insulin-dependent diabetic who comes in today stating that she has not been taking her insulin for the past year. She has had nausea, vomiting, and diarrhea for the past 2 days. Eyes any fever or chills. She states that she has had some greenish colored vomitus. She denies any blood in her emesis or in her stool. Review of her records revealed both type I and type 2 diabetes with morbid obesity. The patient is an extremely poor historian-I expect from my interview that she has some developmental delay. She has difficulty answering questions relating to herself and to time and place. She reports that she did not graduate from high school only going to 11th grade and currently lives at home with her mother and sister and is not employed.  She reports last menstrual period as last month. She states she is sexually active with 3 partners and is not using birth control. Past Medical History  Diagnosis Date  . Asthma   . Diabetes mellitus type I (HCC)   . Diabetes mellitus type II   . Menorrhagia with regular cycle   . Obesity    Past Surgical History  Procedure Laterality Date  . Dental surgery     Family History  Problem Relation Age of Onset  . Obesity Mother   . Hypertension Maternal Grandmother   . Cancer Maternal Grandmother   . Diabetes Maternal Grandfather   . Kidney disease Maternal Grandfather   . Obesity Maternal Grandfather   . Diabetes Paternal Grandmother   . Obesity Paternal Grandmother   . Obesity Father   . Obesity Sister   . Obesity Brother   . Diabetes Maternal Aunt   . Obesity Maternal Aunt   . Diabetes Paternal Aunt    Social History  Substance Use  Topics  . Smoking status: Passive Smoke Exposure - Never Smoker  . Smokeless tobacco: None  . Alcohol Use: No   OB History    No data available     Review of Systems  All other systems reviewed and are negative.     Allergies  Bee venom and Strawberry extract  Home Medications   Prior to Admission medications   Medication Sig Start Date End Date Taking? Authorizing Provider  ACCU-CHEK FASTCLIX LANCETS MISC 1 each by Does not apply route as directed. Check sugar 6 x daily 02/07/12   Dessa Phi, MD  albuterol (PROVENTIL HFA;VENTOLIN HFA) 108 (90 BASE) MCG/ACT inhaler Inhale 2 puffs into the lungs every 6 (six) hours as needed for wheezing or shortness of breath. 08/22/11   Adlih Moreno-Coll, MD  albuterol (PROVENTIL HFA;VENTOLIN HFA) 108 (90 BASE) MCG/ACT inhaler Inhale 1-2 puffs into the lungs every 6 (six) hours as needed for wheezing. 12/01/13   Riki Sheer, PA-C  albuterol (PROVENTIL) (2.5 MG/3ML) 0.083% nebulizer solution Take 3 mLs (2.5 mg total) by nebulization every 6 (six) hours as needed for wheezing or shortness of breath. 12/01/13   Riki Sheer, PA-C  amoxicillin-clavulanate (AUGMENTIN) 875-125 MG per tablet Take 1 tablet by mouth 2 (two) times daily. One po bid x 7 days 12/14/14   Mercedes Camprubi-Soms, PA-C  azithromycin (ZITHROMAX) 250 MG tablet Take 1 tablet (250 mg  total) by mouth daily. Take first 2 tablets together, then 1 every day until finished. 07/14/14   Marissa Sciacca, PA-C  beclomethasone (QVAR) 40 MCG/ACT inhaler Inhale 2 puffs into the lungs 2 (two) times daily.    Historical Provider, MD  cephALEXin (KEFLEX) 500 MG capsule Take 1 capsule (500 mg total) by mouth 4 (four) times daily. Patient not taking: Reported on 07/14/2014 05/06/13   Cathlyn ParsonsAngela M Kabbe, NP  fluconazole (DIFLUCAN) 150 MG tablet Take 1 tablet (150 mg total) by mouth once. Repeat in 1 week. Patient not taking: Reported on 07/14/2014 11/29/13   Linna HoffJames D Kindl, MD  glucagon 1 MG injection Use  for Severe Hypoglycemia . Inject 1 mg intramuscularly if unresponsive, unable to swallow, unconscious and/or has seizure 02/07/12 02/06/13  Dessa PhiJennifer Badik, MD  ibuprofen (ADVIL,MOTRIN) 200 MG tablet Take 200 mg by mouth every 6 (six) hours as needed. 02/20/12   Historical Provider, MD  insulin aspart (NOVOLOG FLEXPEN) 100 UNIT/ML injection Up to 50 units daily as directed by MD 02/07/12 02/06/13  Dessa PhiJennifer Badik, MD  insulin aspart (NOVOLOG) 100 UNIT/ML injection Inject 5-20 Units into the skin 3 (three) times daily before meals. Sliding scale    Historical Provider, MD  insulin glargine (LANTUS SOLOSTAR) 100 UNIT/ML injection Up to 50 units per day as directed by MD 02/07/12 02/06/13  Dessa PhiJennifer Badik, MD  insulin glargine (LANTUS) 100 UNIT/ML injection Inject 40 Units into the skin at bedtime.    Historical Provider, MD  loratadine (CLARITIN) 10 MG tablet Take 10 mg by mouth daily.    Historical Provider, MD  metFORMIN (GLUCOPHAGE) 500 MG tablet Take 1 tablet (500 mg total) by mouth 2 (two) times daily with a meal. 02/07/12 02/06/13  Dessa PhiJennifer Badik, MD  metFORMIN (GLUCOPHAGE) 500 MG tablet Take 500 mg by mouth 2 (two) times daily with a meal.    Historical Provider, MD  montelukast (SINGULAIR) 10 MG tablet Take 1 tablet (10 mg total) by mouth at bedtime. 08/22/11   Adlih Moreno-Coll, MD  nystatin cream (MYCOSTATIN) Apply topically 2 (two) times daily. Patient not taking: Reported on 07/14/2014 05/06/13   Cathlyn ParsonsAngela M Kabbe, NP  predniSONE (DELTASONE) 10 MG tablet Take 2 tablets (20 mg total) by mouth daily. Patient not taking: Reported on 07/14/2014 05/06/13   Cathlyn ParsonsAngela M Kabbe, NP  predniSONE (DELTASONE) 20 MG tablet 2 tabs po once daily x 3 days Patient not taking: Reported on 07/14/2014 04/04/13   Hayden Rasmussenavid Mabe, NP  terconazole (TERAZOL 3) 80 MG vaginal suppository Place 1 suppository (80 mg total) vaginally at bedtime. Patient not taking: Reported on 07/14/2014 11/29/13   Linna HoffJames D Kindl, MD  triamcinolone (KENALOG) 0.025 % ointment  Apply 1 application topically 2 (two) times daily. 06/08/15   Linna HoffJames D Kindl, MD   BP 118/40 mmHg  Pulse 150  Temp(Src) 99.4 F (37.4 C) (Oral)  Resp 18  Ht 5\' 4"  (1.626 m)  Wt 240 lb 4 oz (108.977 kg)  BMI 41.22 kg/m2  SpO2 97%  LMP 06/01/2015 Physical Exam  Constitutional: She is oriented to person, place, and time. She appears well-developed and well-nourished. No distress.  Morbidly obese  HENT:  Head: Normocephalic and atraumatic.  Right Ear: External ear normal.  Left Ear: External ear normal.  Nose: Nose normal.  Mucous membranes are dry  Eyes: EOM are normal. Pupils are equal, round, and reactive to light.  Neck: Normal range of motion. Neck supple.  Cardiovascular: Tachycardia present.   Pulmonary/Chest: Effort normal and breath sounds normal.  Abdominal: Soft. Bowel sounds are normal. She exhibits no distension. There is no tenderness. There is no rebound.  Musculoskeletal: Normal range of motion. She exhibits no edema or tenderness.  Neurological: She is alert and oriented to person, place, and time. She displays normal reflexes. No cranial nerve deficit. She exhibits normal muscle tone. Coordination normal.  Skin: Skin is warm and dry. She is not diaphoretic.  Psychiatric: Her speech is normal.  Affect flat  Nursing note and vitals reviewed.   ED Course  Procedures (including critical care time) Labs Review Labs Reviewed  COMPREHENSIVE METABOLIC PANEL - Abnormal; Notable for the following:    Sodium 122 (*)    Chloride 89 (*)    CO2 16 (*)    Glucose, Bld 571 (*)    Creatinine, Ser 1.54 (*)    Total Protein 8.4 (*)    Albumin 2.9 (*)    GFR calc non Af Amer 48 (*)    GFR calc Af Amer 56 (*)    Anion gap 17 (*)    All other components within normal limits  CBC - Abnormal; Notable for the following:    WBC 17.3 (*)    MCV 76.0 (*)    MCH 24.6 (*)    All other components within normal limits  URINALYSIS, ROUTINE W REFLEX MICROSCOPIC (NOT AT [facility]) -  Abnormal; Notable for the following:    APPearance CLOUDY (*)    Specific Gravity, Urine 1.033 (*)    Glucose, UA >1000 (*)    Hgb urine dipstick LARGE (*)    Bilirubin Urine MODERATE (*)    Ketones, ur >80 (*)    Protein, ur 100 (*)    All other components within normal limits  URINE MICROSCOPIC-ADD ON - Abnormal; Notable for the following:    Squamous Epithelial / LPF FEW (*)    Casts GRANULAR CAST (*)    All other components within normal limits  URINE CULTURE  LIPASE, BLOOD  BETA-HYDROXYBUTYRIC ACID  I-STAT BETA HCG BLOOD, ED ([facility], [facility], [facility] ONLY)  CBG MONITORING, ED  CBG MONITORING, ED  I-STAT VENOUS BLOOD GAS, ED    Imaging Review No results found. I have personally reviewed and evaluated these images and lab results as part of my medical decision-making.   EKG Interpretation   Date/Time:  Saturday June 20 2015 17:02:24 EST Ventricular Rate:  147 PR Interval:  126 QRS Duration: 85 QT Interval:  279 QTC Calculation: 436 R Axis:   113 Text Interpretation:  Sinus tachycardia Non-specific ST-t changes  Confirmed by Raven Harmes MD, Duwayne Heck [phone]) on 06/20/2015 5:15:19 PM      MDM   Final diagnoses:  Diabetic ketoacidosis without coma associated with type 1 diabetes mellitus (HCC)  Nausea vomiting and diarrhea    19 year old diabetic female resents with noncompliance to medications, nausea vomiting, diarrhea, and hyper glycemia with elevated anion gap consistent with DKA. Patient is having volume depletion and insulin drip started here in the ED and will need admission for further treatment and evaluation.Patient with diffuse upper abdominal pain-normal lft and lipase. Urine being cultured.  Plan treatment for dka and likely reassess when improved for need for further abdominal assessment.   Discussed with Dr. Rosendo Gros and plan admission to step down.   Filed Vitals:   06/20/15 1830  BP: 131/73  Pulse: 147  Temp:   Resp: 23   6:37 PM   Margarita Grizzle,  MD 06/20/15 1837\  CRITICAL CARE Performed by: Hilario Quarry Total critical care  time: 60 minutes Critical care time was exclusive of separately billable procedures and treating other patients. Critical care was necessary to treat or prevent imminent or life-threatening deterioration. Critical care was time spent personally by me on the following activities: development of treatment plan with patient and/or surrogate as well as nursing, discussions with consultants, evaluation of patient's response to treatment, examination of patient, obtaining history from patient or surrogate, ordering and performing treatments and interventions, ordering and review of laboratory studies, ordering and review of radiographic studies, pulse oximetry and re-evaluation of patient's condition.   Margarita Grizzle, MD 06/20/15 [phone]

## 2015-06-21 ENCOUNTER — Other Ambulatory Visit: Payer: Self-pay

## 2015-06-21 ENCOUNTER — Inpatient Hospital Stay (HOSPITAL_COMMUNITY): Payer: Medicaid Other

## 2015-06-21 DIAGNOSIS — R079 Chest pain, unspecified: Secondary | ICD-10-CM

## 2015-06-21 DIAGNOSIS — R000 Tachycardia, unspecified: Secondary | ICD-10-CM

## 2015-06-21 DIAGNOSIS — R7881 Bacteremia: Secondary | ICD-10-CM

## 2015-06-21 DIAGNOSIS — E1142 Type 2 diabetes mellitus with diabetic polyneuropathy: Secondary | ICD-10-CM

## 2015-06-21 DIAGNOSIS — I471 Supraventricular tachycardia: Secondary | ICD-10-CM

## 2015-06-21 LAB — BASIC METABOLIC PANEL
Anion gap: 12 (ref 5–15)
Anion gap: 8 (ref 5–15)
Anion gap: 11 (ref 5–15)
BUN: 10 mg/dL (ref 6–20)
BUN: 12 mg/dL (ref 6–20)
BUN: 14 mg/dL (ref 6–20)
CALCIUM: 7.9 mg/dL — AB (ref 8.9–10.3)
CHLORIDE: 104 mmol/L (ref 101–111)
CHLORIDE: 106 mmol/L (ref 101–111)
CHLORIDE: 99 mmol/L — AB (ref 101–111)
CO2: 16 mmol/L — ABNORMAL LOW (ref 22–32)
CO2: 17 mmol/L — AB (ref 22–32)
CO2: 18 mmol/L — ABNORMAL LOW (ref 22–32)
CREATININE: 1.09 mg/dL — AB (ref 0.44–1.00)
Calcium: 7.8 mg/dL — ABNORMAL LOW (ref 8.9–10.3)
Calcium: 7.8 mg/dL — ABNORMAL LOW (ref 8.9–10.3)
Creatinine, Ser: 1 mg/dL (ref 0.44–1.00)
Creatinine, Ser: 0.92 mg/dL (ref 0.44–1.00)
GFR calc non Af Amer: >60 mL/min (ref >60)
GFR calc non Af Amer: >60 mL/min (ref >60)
GFR calc non Af Amer: >60 mL/min (ref >60)
Glucose, Bld: 299 mg/dL — ABNORMAL HIGH (ref 65–99)
Glucose, Bld: 214 mg/dL — ABNORMAL HIGH (ref 65–99)
Glucose, Bld: 161 mg/dL — ABNORMAL HIGH (ref 65–99)
POTASSIUM: 4.1 mmol/L (ref 3.5–5.1)
POTASSIUM: 5 mmol/L (ref 3.5–5.1)
Potassium: 4.3 mmol/L (ref 3.5–5.1)
SODIUM: 128 mmol/L — AB (ref 135–145)
SODIUM: 131 mmol/L — AB (ref 135–145)
SODIUM: 132 mmol/L — AB (ref 135–145)

## 2015-06-21 LAB — GLUCOSE, CAPILLARY
GLUCOSE-CAPILLARY: 119 mg/dL — AB (ref 65–99)
GLUCOSE-CAPILLARY: 151 mg/dL — AB (ref 65–99)
GLUCOSE-CAPILLARY: 177 mg/dL — AB (ref 65–99)
GLUCOSE-CAPILLARY: 202 mg/dL — AB (ref 65–99)
GLUCOSE-CAPILLARY: 202 mg/dL — AB (ref 65–99)
GLUCOSE-CAPILLARY: 215 mg/dL — AB (ref 65–99)
GLUCOSE-CAPILLARY: 286 mg/dL — AB (ref 65–99)
GLUCOSE-CAPILLARY: 323 mg/dL — AB (ref 65–99)
Glucose-Capillary: 314 mg/dL — ABNORMAL HIGH (ref 65–99)
Glucose-Capillary: 248 mg/dL — ABNORMAL HIGH (ref 65–99)
Glucose-Capillary: 120 mg/dL — ABNORMAL HIGH (ref 65–99)
Glucose-Capillary: 142 mg/dL — ABNORMAL HIGH (ref 65–99)
Glucose-Capillary: 249 mg/dL — ABNORMAL HIGH (ref 65–99)
Glucose-Capillary: 194 mg/dL — ABNORMAL HIGH (ref 65–99)
Glucose-Capillary: 132 mg/dL — ABNORMAL HIGH (ref 65–99)

## 2015-06-21 LAB — LACTIC ACID, PLASMA
LACTIC ACID, VENOUS: 1.1 mmol/L (ref 0.5–2.0)
Lactic Acid, Venous: 1.8 mmol/L (ref 0.5–2.0)

## 2015-06-21 LAB — CBC WITH DIFFERENTIAL/PLATELET
BASOS PCT: 0 %
Basophils Absolute: 0 10*3/uL (ref 0.0–0.1)
EOS ABS: 0 10*3/uL (ref 0.0–0.7)
EOS PCT: 0 %
HCT: 27.5 % — ABNORMAL LOW (ref 36.0–46.0)
Hemoglobin: 9 g/dL — ABNORMAL LOW (ref 12.0–15.0)
LYMPHS ABS: 1.6 10*3/uL (ref 0.7–4.0)
Lymphocytes Relative: 17 %
MCH: 24.6 pg — AB (ref 26.0–34.0)
MCHC: 32.7 g/dL (ref 30.0–36.0)
MCV: 75.1 fL — ABNORMAL LOW (ref 78.0–100.0)
MONOS PCT: 6 %
Monocytes Absolute: 0.5 10*3/uL (ref 0.1–1.0)
Neutro Abs: 6.8 10*3/uL (ref 1.7–7.7)
Neutrophils Relative %: 76 %
PLATELETS: 192 10*3/uL (ref 150–400)
RBC: 3.66 MIL/uL — AB (ref 3.87–5.11)
RDW: 13.9 % (ref 11.5–15.5)
WBC: 9 10*3/uL (ref 4.0–10.5)

## 2015-06-21 LAB — BASIC METABOLIC PANEL WITH GFR
Anion gap: 7 (ref 5–15)
BUN: 8 mg/dL (ref 6–20)
CO2: 17 mmol/L — ABNORMAL LOW (ref 22–32)
Calcium: 7.4 mg/dL — ABNORMAL LOW (ref 8.9–10.3)
Chloride: 105 mmol/L (ref 101–111)
Creatinine, Ser: 0.97 mg/dL (ref 0.44–1.00)
GFR calc Af Amer: >60 mL/min
GFR calc non Af Amer: >60 mL/min
Glucose, Bld: 138 mg/dL — ABNORMAL HIGH (ref 65–99)
Potassium: 3.9 mmol/L (ref 3.5–5.1)
Sodium: 129 mmol/L — ABNORMAL LOW (ref 135–145)

## 2015-06-21 LAB — TROPONIN I
Troponin I: 0.05 ng/mL — ABNORMAL HIGH
Troponin I: <0.03 ng/mL (ref <0.031)
Troponin I: <0.03 ng/mL (ref <0.031)

## 2015-06-21 LAB — MAGNESIUM: MAGNESIUM: 2 mg/dL (ref 1.7–2.4)

## 2015-06-21 LAB — LIPID PANEL
CHOLESTEROL: 69 mg/dL (ref 0–200)
HDL: 10 mg/dL — AB (ref >40)
LDL CALC: 41 mg/dL (ref 0–99)
TRIGLYCERIDES: 88 mg/dL (ref <150)
Total CHOL/HDL Ratio: 6.9 RATIO
VLDL: 18 mg/dL (ref 0–40)

## 2015-06-21 LAB — TSH: TSH: 2.135 u[IU]/mL (ref 0.350–4.500)

## 2015-06-21 LAB — MRSA PCR SCREENING: MRSA BY PCR: NEGATIVE

## 2015-06-21 MED ORDER — METOPROLOL TARTRATE 1 MG/ML IV SOLN
2.5000 mg | Freq: Four times a day (QID) | INTRAVENOUS | Status: DC | PRN
  Administered 2015-06-21: 2.5 mg via INTRAVENOUS
  Filled 2015-06-21: qty 5

## 2015-06-21 MED ORDER — SODIUM CHLORIDE 0.9 % IV SOLN
INTRAVENOUS | Status: AC
  Filled 2015-06-21: qty 2.5

## 2015-06-21 MED ORDER — DEXTROSE-NACL 5-0.45 % IV SOLN
INTRAVENOUS | Status: AC
  Administered 2015-06-21: 09:00:00 via INTRAVENOUS

## 2015-06-21 MED ORDER — INSULIN ASPART 100 UNIT/ML ~~LOC~~ SOLN
0.0000 [IU] | Freq: Every day | SUBCUTANEOUS | Status: DC
  Administered 2015-06-21 – 2015-06-22 (×2): 3 [IU] via SUBCUTANEOUS
  Administered 2015-06-24: 2 [IU] via SUBCUTANEOUS

## 2015-06-21 MED ORDER — MORPHINE SULFATE (PF) 2 MG/ML IV SOLN: 2.0000 mg | INTRAVENOUS | Status: DC | PRN

## 2015-06-21 MED ORDER — METOPROLOL TARTRATE 1 MG/ML IV SOLN
5.0000 mg | INTRAVENOUS | Status: DC | PRN
  Administered 2015-06-21 (×2): 5 mg via INTRAVENOUS
  Filled 2015-06-21 (×2): qty 5

## 2015-06-21 MED ORDER — INSULIN ASPART 100 UNIT/ML ~~LOC~~ SOLN
3.0000 [IU] | Freq: Three times a day (TID) | SUBCUTANEOUS | Status: DC
  Administered 2015-06-21 – 2015-06-24 (×5): 3 [IU] via SUBCUTANEOUS

## 2015-06-21 MED ORDER — INSULIN GLARGINE 100 UNIT/ML ~~LOC~~ SOLN
25.0000 [IU] | Freq: Every day | SUBCUTANEOUS | Status: DC
  Administered 2015-06-21 – 2015-06-22 (×2): 25 [IU] via SUBCUTANEOUS
  Filled 2015-06-21 (×2): qty 0.25

## 2015-06-21 MED ORDER — PIPERACILLIN-TAZOBACTAM 3.375 G IVPB
3.3750 g | Freq: Three times a day (TID) | INTRAVENOUS | Status: DC
  Administered 2015-06-21 – 2015-06-23 (×7): 3.375 g via INTRAVENOUS
  Filled 2015-06-21 (×8): qty 50

## 2015-06-21 MED ORDER — METOPROLOL TARTRATE 1 MG/ML IV SOLN
5.0000 mg | INTRAVENOUS | Status: AC | PRN
  Administered 2015-06-21: 5 mg via INTRAVENOUS
  Filled 2015-06-21: qty 5

## 2015-06-21 MED ORDER — ADENOSINE 6 MG/2ML IV SOLN
12.0000 mg | Freq: Once | INTRAVENOUS | Status: AC
  Administered 2015-06-21: 12 mg via INTRAVENOUS

## 2015-06-21 MED ORDER — ADENOSINE 6 MG/2ML IV SOLN
INTRAVENOUS | Status: AC
  Filled 2015-06-21: qty 2

## 2015-06-21 MED ORDER — INFLUENZA VAC SPLIT QUAD 0.5 ML IM SUSY
0.5000 mL | PREFILLED_SYRINGE | INTRAMUSCULAR | Status: DC
  Filled 2015-06-21: qty 0.5

## 2015-06-21 MED ORDER — METOPROLOL TARTRATE 1 MG/ML IV SOLN
INTRAVENOUS | Status: AC
  Administered 2015-06-21: 5 mg
  Filled 2015-06-21: qty 5

## 2015-06-21 MED ORDER — MORPHINE SULFATE (PF) 2 MG/ML IV SOLN
2.0000 mg | Freq: Once | INTRAVENOUS | Status: AC
  Administered 2015-06-21: 2 mg via INTRAVENOUS

## 2015-06-21 MED ORDER — SODIUM CHLORIDE 0.9 % IV SOLN
INTRAVENOUS | Status: AC
  Administered 2015-06-21 – 2015-06-22 (×3): via INTRAVENOUS

## 2015-06-21 MED ORDER — POTASSIUM CHLORIDE 10 MEQ/100ML IV SOLN
10.0000 meq | INTRAVENOUS | Status: AC
  Administered 2015-06-21: 10 meq via INTRAVENOUS
  Filled 2015-06-21: qty 100

## 2015-06-21 MED ORDER — INSULIN ASPART 100 UNIT/ML ~~LOC~~ SOLN
0.0000 [IU] | Freq: Three times a day (TID) | SUBCUTANEOUS | Status: DC
  Administered 2015-06-21: 15 [IU] via SUBCUTANEOUS
  Administered 2015-06-22 (×3): 11 [IU] via SUBCUTANEOUS
  Administered 2015-06-23: 4 [IU] via SUBCUTANEOUS
  Administered 2015-06-23: 7 [IU] via SUBCUTANEOUS
  Administered 2015-06-23 – 2015-06-24 (×2): 11 [IU] via SUBCUTANEOUS

## 2015-06-21 MED ORDER — SODIUM CHLORIDE 0.9 % IV BOLUS (SEPSIS)
1000.0000 mL | Freq: Once | INTRAVENOUS | Status: AC
  Administered 2015-06-21: 1000 mL via INTRAVENOUS

## 2015-06-21 MED ORDER — METOPROLOL TARTRATE 25 MG PO TABS
25.0000 mg | ORAL_TABLET | Freq: Three times a day (TID) | ORAL | Status: DC
  Administered 2015-06-21 – 2015-06-23 (×4): 25 mg via ORAL
  Filled 2015-06-21 (×5): qty 1

## 2015-06-21 NOTE — Consult Note (Signed)
Primary Physician: Primary Cardiologist:  New   HPI: Asked to see re tachycardia  Pt is a [age] yo who was admitted on 11/12 with 2 day history of N/V and abdominal pain.  Hx of DM  Stopped meds 1 year ago.  WOke up yesterday with CP that went away Came to ER  Given Insulin and fluids  G- rods in blood  Rx for SIRS with ABX.   HR has remained elevated since admit       Past Medical History  Diagnosis Date  . Asthma   . Diabetes mellitus type I (HCC)   . Diabetes mellitus type II   . Menorrhagia with regular cycle   . Obesity     Medications Prior to Admission  Medication Sig Dispense Refill  . albuterol (PROVENTIL HFA;VENTOLIN HFA) 108 (90 BASE) MCG/ACT inhaler Inhale 2 puffs into the lungs every 6 (six) hours as needed for wheezing or shortness of breath. 1 Inhaler 0  . albuterol (PROVENTIL) (2.5 MG/3ML) 0.083% nebulizer solution Take 3 mLs (2.5 mg total) by nebulization every 6 (six) hours as needed for wheezing or shortness of breath. 75 mL 12  . beclomethasone (QVAR) 40 MCG/ACT inhaler Inhale 2 puffs into the lungs 2 (two) times daily.    Marland Kitchen. ibuprofen (ADVIL,MOTRIN) 200 MG tablet Take 200 mg by mouth every 6 (six) hours as needed.    . loratadine (CLARITIN) 10 MG tablet Take 10 mg by mouth daily.    . montelukast (SINGULAIR) 10 MG tablet Take 1 tablet (10 mg total) by mouth at bedtime. 30 tablet 0  . glucagon 1 MG injection Use for Severe Hypoglycemia . Inject 1 mg intramuscularly if unresponsive, unable to swallow, unconscious and/or has seizure 2 each 3  . insulin aspart (NOVOLOG FLEXPEN) 100 UNIT/ML injection Up to 50 units daily as directed by MD (Patient not taking: Reported on 06/20/2015) 5 pen 3  . insulin glargine (LANTUS SOLOSTAR) 100 UNIT/ML injection Up to 50 units per day as directed by MD (Patient not taking: Reported on 06/20/2015) 15 mL 3  . metFORMIN (GLUCOPHAGE) 500 MG tablet Take 1 tablet (500 mg total) by mouth 2 (two) times daily with a meal. (Patient not  taking: Reported on 06/20/2015) 60 tablet 11  . triamcinolone (KENALOG) 0.025 % ointment Apply 1 application topically 2 (two) times daily. 30 g 2     . budesonide (PULMICORT) nebulizer solution  0.25 mg Nebulization BID  . enoxaparin (LOVENOX) injection  40 mg Subcutaneous Q24H  . [START ON 06/22/2015] Influenza vac split quadrivalent PF  0.5 mL Intramuscular Tomorrow-1000  . insulin aspart  0-20 Units Subcutaneous TID WC  . insulin aspart  0-5 Units Subcutaneous QHS  . insulin aspart  3 Units Subcutaneous TID WC  . insulin glargine  25 Units Subcutaneous Daily  . montelukast  10 mg Oral QHS  . piperacillin-tazobactam (ZOSYN)  IV  3.375 g Intravenous 3 times per day    Infusions: . sodium chloride Stopped (06/20/15 2357)  . sodium chloride 150 mL/hr at 06/21/15 1303    Allergies  Allergen Reactions  . Bee Venom Anaphylaxis and Swelling  . Strawberry Extract Hives and Swelling    Social History   Social History  . Marital Status: Single    Spouse Name: N/A  . Number of Children: N/A  . Years of Education: N/A   Occupational History  . Not on file.   Social History Main Topics  . Smoking status: Passive Smoke Exposure -  Never Smoker  . Smokeless tobacco: Not on file  . Alcohol Use: No  . Drug Use: No  . Sexual Activity: Not on file   Other Topics Concern  . Not on file   Social History Narrative   Is in 10th grade at [employer] with mom, step dad, brother, sister             Family History  Problem Relation Age of Onset  . Obesity Mother   . Hypertension Maternal Grandmother   . Cancer Maternal Grandmother   . Diabetes Maternal Grandfather   . Kidney disease Maternal Grandfather   . Obesity Maternal Grandfather   . Diabetes Paternal Grandmother   . Obesity Paternal Grandmother   . Obesity Father   . Obesity Sister   . Obesity Brother   . Diabetes Maternal Aunt   . Obesity Maternal Aunt   . Diabetes Paternal Aunt     REVIEW OF SYSTEMS:  All  systems reviewed  Negative to the above problem except as noted above.    PHYSICAL EXAM: Filed Vitals:   06/21/15 1619  BP: 122/80  Pulse: 155  Temp: 101.4 F (38.6 C)  Resp: 18     Intake/Output Summary (Last 24 hours) at 06/21/15 1741 Last data filed at 06/21/15 1600  Gross per 24 hour  Intake 3648.74 ml  Output   1200 ml  Net 2448.74 ml    General:  Obese 19 yo in NAD   HEENT: normal Neck: supple. no JVD. Carotids 2+ bilat; no bruits. No lymphadenopathy or thryomegaly appreciated. Cor:  Regular rate & rhythm. No rubs, gallops or murmurs.  Distant Lungs: Rel clear Abdomen: soft, nontender, nondistended. No hepatosplenomegaly. No bruits or masses. Good bowel sounds. Extremities: no cyanosis, clubbing, rash, edema Neuro: alert & oriented x 3, cranial nerves grossly intact. moves all 4 extremities w/o difficulty. Affect pleasant.  ECG:  SVT 153 bpm  Yesterday ST 147 bpm    Results for orders placed or performed during the hospital encounter of 06/20/15 (from the past 24 hour(s))  CBG monitoring, ED     Status: Abnormal   Collection Time: 06/20/15  6:18 PM  Result Value Ref Range   Glucose-Capillary 457 (H) 65 - 99 mg/dL  CBG monitoring, ED     Status: Abnormal   Collection Time: 06/20/15  7:24 PM  Result Value Ref Range   Glucose-Capillary 403 (H) 65 - 99 mg/dL  Culture, blood (routine x 2)     Status: None (Preliminary result)   Collection Time: 06/20/15  7:50 PM  Result Value Ref Range   Specimen Description BLOOD LEFT HAND    Special Requests BOTTLES DRAWN AEROBIC AND ANAEROBIC 5CC    Culture  Setup Time      GRAM NEGATIVE RODS AEROBIC BOTTLE ONLY CRITICAL RESULT CALLED TO, READ BACK BY AND VERIFIED WITH: P SHELTON,RN AT 0908 06/21/15 BY L BENFIELD    Culture GRAM NEGATIVE RODS    Report Status PENDING   CBG monitoring, ED     Status: Abnormal   Collection Time: 06/20/15  8:36 PM  Result Value Ref Range   Glucose-Capillary 342 (H) 65 - 99 mg/dL   Comment 1  Notify RN    Comment 2 Document in Chart   Glucose, capillary     Status: Abnormal   Collection Time: 06/20/15  9:32 PM  Result Value Ref Range   Glucose-Capillary 314 (H) 65 - 99 mg/dL  Basic metabolic panel  Status: Abnormal   Collection Time: 06/20/15 10:08 PM  Result Value Ref Range   Sodium 130 (L) 135 - 145 mmol/L   Potassium 4.3 3.5 - 5.1 mmol/L   Chloride 101 101 - 111 mmol/L   CO2 19 (L) 22 - 32 mmol/L   Glucose, Bld 301 (H) 65 - 99 mg/dL   BUN 14 6 - 20 mg/dL   Creatinine, Ser 1.61 (H) 0.44 - 1.00 mg/dL   Calcium 7.8 (L) 8.9 - 10.3 mg/dL   GFR calc non Af Amer >60 >60 mL/min   GFR calc Af Amer >60 >60 mL/min   Anion gap 10 5 - 15  Glucose, capillary     Status: Abnormal   Collection Time: 06/20/15 10:43 PM  Result Value Ref Range   Glucose-Capillary 248 (H) 65 - 99 mg/dL  MRSA PCR Screening     Status: None   Collection Time: 06/20/15 11:12 PM  Result Value Ref Range   MRSA by PCR NEGATIVE NEGATIVE  Glucose, capillary     Status: Abnormal   Collection Time: 06/20/15 11:45 PM  Result Value Ref Range   Glucose-Capillary 245 (H) 65 - 99 mg/dL   Comment 1 Capillary Specimen   Magnesium     Status: None   Collection Time: 06/21/15 12:00 AM  Result Value Ref Range   Magnesium 2.0 1.7 - 2.4 mg/dL  Basic metabolic panel (stat then every 4 hours)     Status: Abnormal   Collection Time: 06/21/15 12:00 AM  Result Value Ref Range   Sodium 128 (L) 135 - 145 mmol/L   Potassium 4.3 3.5 - 5.1 mmol/L   Chloride 99 (L) 101 - 111 mmol/L   CO2 17 (L) 22 - 32 mmol/L   Glucose, Bld 299 (H) 65 - 99 mg/dL   BUN 14 6 - 20 mg/dL   Creatinine, Ser 0.96 (H) 0.44 - 1.00 mg/dL   Calcium 7.9 (L) 8.9 - 10.3 mg/dL   GFR calc non Af Amer >60 >60 mL/min   GFR calc Af Amer >60 >60 mL/min   Anion gap 12 5 - 15  Troponin I (q 6hr x 3)     Status: Abnormal   Collection Time: 06/21/15 12:00 AM  Result Value Ref Range   Troponin I 0.05 (H) <0.031 ng/mL  Glucose, capillary     Status:  Abnormal   Collection Time: 06/21/15 12:49 AM  Result Value Ref Range   Glucose-Capillary 215 (H) 65 - 99 mg/dL   Comment 1 Capillary Specimen   Glucose, capillary     Status: Abnormal   Collection Time: 06/21/15  1:52 AM  Result Value Ref Range   Glucose-Capillary 202 (H) 65 - 99 mg/dL  TSH     Status: None   Collection Time: 06/21/15  2:10 AM  Result Value Ref Range   TSH 2.135 0.350 - 4.500 uIU/mL  Basic metabolic panel (stat then every 4 hours)     Status: Abnormal   Collection Time: 06/21/15  2:10 AM  Result Value Ref Range   Sodium 132 (L) 135 - 145 mmol/L   Potassium 4.1 3.5 - 5.1 mmol/L   Chloride 106 101 - 111 mmol/L   CO2 18 (L) 22 - 32 mmol/L   Glucose, Bld 214 (H) 65 - 99 mg/dL   BUN 12 6 - 20 mg/dL   Creatinine, Ser 0.45 0.44 - 1.00 mg/dL   Calcium 7.8 (L) 8.9 - 10.3 mg/dL   GFR calc non Af Amer >60 >60  mL/min   GFR calc Af Amer >60 >60 mL/min   Anion gap 8 5 - 15  Lipid panel     Status: Abnormal   Collection Time: 06/21/15  2:10 AM  Result Value Ref Range   Cholesterol 69 0 - 200 mg/dL   Triglycerides 88 <147 mg/dL   HDL 10 (L) >82 mg/dL   Total CHOL/HDL Ratio 6.9 RATIO   VLDL 18 0 - 40 mg/dL   LDL Cholesterol 41 0 - 99 mg/dL  Glucose, capillary     Status: Abnormal   Collection Time: 06/21/15  2:57 AM  Result Value Ref Range   Glucose-Capillary 202 (H) 65 - 99 mg/dL   Comment 1 Capillary Specimen   Glucose, capillary     Status: Abnormal   Collection Time: 06/21/15  4:05 AM  Result Value Ref Range   Glucose-Capillary 119 (H) 65 - 99 mg/dL   Comment 1 Capillary Specimen   Glucose, capillary     Status: Abnormal   Collection Time: 06/21/15  5:10 AM  Result Value Ref Range   Glucose-Capillary 120 (H) 65 - 99 mg/dL   Comment 1 Capillary Specimen   Glucose, capillary     Status: Abnormal   Collection Time: 06/21/15  6:09 AM  Result Value Ref Range   Glucose-Capillary 142 (H) 65 - 99 mg/dL   Comment 1 Capillary Specimen   Basic metabolic panel (stat  then every 4 hours)     Status: Abnormal   Collection Time: 06/21/15  6:15 AM  Result Value Ref Range   Sodium 131 (L) 135 - 145 mmol/L   Potassium 5.0 3.5 - 5.1 mmol/L   Chloride 104 101 - 111 mmol/L   CO2 16 (L) 22 - 32 mmol/L   Glucose, Bld 161 (H) 65 - 99 mg/dL   BUN 10 6 - 20 mg/dL   Creatinine, Ser 9.56 0.44 - 1.00 mg/dL   Calcium 7.8 (L) 8.9 - 10.3 mg/dL   GFR calc non Af Amer >60 >60 mL/min   GFR calc Af Amer >60 >60 mL/min   Anion gap 11 5 - 15  Troponin I (q 6hr x 3)     Status: None   Collection Time: 06/21/15  6:27 AM  Result Value Ref Range   Troponin I <0.03 <0.031 ng/mL  Glucose, capillary     Status: Abnormal   Collection Time: 06/21/15  7:08 AM  Result Value Ref Range   Glucose-Capillary 151 (H) 65 - 99 mg/dL   Comment 1 Capillary Specimen   Glucose, capillary     Status: Abnormal   Collection Time: 06/21/15  8:06 AM  Result Value Ref Range   Glucose-Capillary 249 (H) 65 - 99 mg/dL   Comment 1 Capillary Specimen   Glucose, capillary     Status: Abnormal   Collection Time: 06/21/15  9:11 AM  Result Value Ref Range   Glucose-Capillary 286 (H) 65 - 99 mg/dL  Glucose, capillary     Status: Abnormal   Collection Time: 06/21/15 10:54 AM  Result Value Ref Range   Glucose-Capillary 194 (H) 65 - 99 mg/dL   Comment 1 Capillary Specimen   Glucose, capillary     Status: Abnormal   Collection Time: 06/21/15 12:07 PM  Result Value Ref Range   Glucose-Capillary 177 (H) 65 - 99 mg/dL   Comment 1 Capillary Specimen   Troponin I (q 6hr x 3)     Status: None   Collection Time: 06/21/15 12:30 PM  Result Value  Ref Range   Troponin I <0.03 <0.031 ng/mL  Basic metabolic panel (stat then every 4 hours)     Status: Abnormal   Collection Time: 06/21/15 12:30 PM  Result Value Ref Range   Sodium 129 (L) 135 - 145 mmol/L   Potassium 3.9 3.5 - 5.1 mmol/L   Chloride 105 101 - 111 mmol/L   CO2 17 (L) 22 - 32 mmol/L   Glucose, Bld 138 (H) 65 - 99 mg/dL   BUN 8 6 - 20 mg/dL    Creatinine, Ser 0.98 0.44 - 1.00 mg/dL   Calcium 7.4 (L) 8.9 - 10.3 mg/dL   GFR calc non Af Amer >60 >60 mL/min   GFR calc Af Amer >60 >60 mL/min   Anion gap 7 5 - 15  Glucose, capillary     Status: Abnormal   Collection Time: 06/21/15  1:01 PM  Result Value Ref Range   Glucose-Capillary 132 (H) 65 - 99 mg/dL  Lactic acid, plasma     Status: None   Collection Time: 06/21/15  2:55 PM  Result Value Ref Range   Lactic Acid, Venous 1.8 0.5 - 2.0 mmol/L  Glucose, capillary     Status: Abnormal   Collection Time: 06/21/15  5:07 PM  Result Value Ref Range   Glucose-Capillary 323 (H) 65 - 99 mg/dL   Comment 1 Capillary Specimen    Dg Chest Portable 1 View  06/20/2015  CLINICAL DATA:  Headache and abdominal pain with nausea, vomiting and diarrhea for 2 days. Initial encounter. EXAM: PORTABLE CHEST 1 VIEW COMPARISON:  PA and lateral chest 07/14/2014. FINDINGS: Heart size and mediastinal contours are within normal limits. Both lungs are clear. Visualized skeletal structures are unremarkable. IMPRESSION: Negative exam. Electronically Signed   By: Drusilla Kanner M.D.   On: 06/20/2015 17:46     ASSESSMENT:  19 yo admitted yesterday with N/V Found to be in DKA  Not taking meds. Has been hydrated some and treated with insulin.  Started on ABX for bacteremia Has remained persistently tachycardic.  Initially Sinus tach  Last night at 9:45 PM developed SVT  She has been in persistent SVT since yesterday.   Echo today shows LVEF is normal  RV does appear to be dilated and function may be depressed  Difficult study given pts body habitus and fast HR  She was given 6 mg adenosine IV and SVT broke  Currently in ST in 100s   Recomm:  1.  Lopressor 25 po tid  Follow BP and HR 2.  Continue hydration for DKA 3.  Rx for infection as doing  4.  WOuld recomm CT of chest to R/O PE  5  Repeat echo when HR is improved (limited scan) to reeval RVEF, pulmonary pressures     Will continue to follow .   If  CT neg consider sleep study  Suspicious pt has sleep apnea

## 2015-06-21 NOTE — Progress Notes (Signed)
FPTS Interim Progress Note  S: Paged approx [phone] by 2H RN after patient was transferred from ED (delayed due to no SDU beds). Reported that patient seems to be in SVT with HR about 180 (previously HR 130 to 140 in ED).  Went to evaluate patient. She was resting in bed, reported to be feeling better than when was admitted, improved back pain. Still with some nausea (improved), without vomiting. Now complains of some chest tightness and pain "everywhere" including chest. Has some sweating with fever checked at Tmax 102F.  O: BP 128/53 mmHg  Pulse 185  Temp(Src) 99.5 F (37.5 C) (Oral)  Resp 20  Ht 5\' 4"  (1.626 m)  Wt 248 lb 4.8 oz (112.628 kg)  BMI 42.60 kg/m2  SpO2 95%  LMP 06/01/2015  General: Morbidly obese, resting in bed, laying on side then shifted to sit up more in bed, NAD HEENT: Dry mucus mem Chest wall - mild reproducible tenderness to palpation mid to lower sternum Cardiovascular: tachycardic, regular rhythm, no murmurs heard Respiratory: Persistent diminished breath sounds, mostly CTAB. No tachypnea. Skin: warm, moist Neuro: Awake, alert, oriented  A/P: Briefly, Brandi Robertson, is a [age] yr female admitted earlier today for mild-mod DKA in uncontrolled T2DM, likely due to non-adherence to insulin (>1 yr)  # DKA, mild-moderate, in setting of uncontrolled insulin dependent Type 2 Diabetes - Improving Clinically febrile, gradual improving symptoms, still nausea. Tolerating sips of liquids. - AG closed from 17 > 10 (last at 2208), bicarb 16 to 19, K 5.1 > 4.3 (s/p 2 IV K runs), Cr improved 1.5 to 1.07 - Mag 2.0 - Continue BMET q 2 hr - Cont IVF @ 175 cc/hr overnight, s/p bolus - Check A1c in AM - Follow cultures, however no clear source of infection. Possible viral gastroenteritis. - Anticipate transition to SQ insulin in AM if nausea improved and gap closed x 2 or more  # Sinus Tachycardia, likely secondary to DKA and fever On admit was sinus tachy to 130-140s in ED. EKG  stable without acute ST-T wave changes or evidence of ischemia. No chest pain or SOB on admit. - On transfer to floor has developed worsening sinus tachy to HR 180s, then developed some chest tightness and discomfort. - Ordered STAT EKG > read as SVT initially, further review and observing telemetry with identified P waves more suggestive of sinus tachycardia - Added Troponin q 6 hr x 3 to lab draw at this time - Tried vagal maneuvers at bedside - Gave Tylenol for fever to 102  - Trial on Diltiazem IV drip, bolus 10, up to max 15, with some minor improvement, then without relief - Morphine IV 2 mg q 2 hr PRN pain, with significant improvement in chest tightness - Called on call Cardiology fellow Dr. Tarri GlennAzeem, reviewed case and EKG, agrees with sinus tachycardia, suspect that at age 19, her max HR could be up to 180 with stress, suspect this is due to DKA and fever. Recommend to continue treating underlying cause, advised to switch Diltiazem drip off and try Metoprolol 2.5 to 5 mg IV q 6 hr PRN sustained HR >180 (to have less effect on BP)  UPDATE 0118 - patient is sitting up in bed, comfortable without chest pain, has diet ginger ale to drink per request, HR improved to 150s.  Smitty CordsAlexander J Keatin Benham, DO 06/21/2015, 1:00 AM PGY-3, [facility] pager [phone]

## 2015-06-21 NOTE — Progress Notes (Signed)
Notified teaching service that patients HR remains in the 155. Patient denies chest pain states that she is having difficulty breathing and chest feels heavy. No orders given.

## 2015-06-21 NOTE — Progress Notes (Addendum)
Lopressor 5 mg given iv for Heart rate and tylenol for temp. IV bolus started Patient states that she cant breath heaviness in chest denies chest pain. Will continue to closely monitor. Teaching service notified

## 2015-06-21 NOTE — Progress Notes (Signed)
Family Medicine Teaching Service Daily Progress Note Intern Pager: [phone]  Patient name: Brandi Robertson [place] Medical record number: [id number] Date of birth: [birth year] Age: 19 y.o. Gender: female  Primary Care Provider: Forest BeckerJENNINGS, JESSICA LYNNE, MD Consultants: none Code Status: Full  Pt Overview and Major Events to Date:  11/12: admitted with mild-mod DKA, type 2 DM, non-adherent insulin, AG 17 > 10 within first several hours 11/12: overnight with persistent sinus tachy, up to 180s, febrile, no source of infection identified. Improved on metoprolol IV PRN 11/13: DKA resolved, AG remains closed, transition to SQ insulin, advance diet, still 11/13: 1 blood cx aerobic (GNR), other pending >> empiric cover pseudomonas, Zosyn (11/13>)  Assessment and Plan:  Brandi Robertson Rieth is a 19 y.o. female presenting with nausea / vomiting, generalized abdominal pain for 2 days found to be in mild-moderate DKA with uncontrolled Insulin Dependent Diabetes. PMH is significant for T2DM (insulin dependent), peripheral neuropathy DM, morbid obesity, mod persistent asthma.  # DKA, mild-moderate, in setting of uncontrolled insulin dependent Type 2 Diabetes - Improving Clinically consistent with mild-mod DKA as she is dehydrated on admit with hyperglycemia to 550s, AG 17, low pH VBG, bicarb 16, significant >80 ketones in urine, and positive beta-hydroxybutyric acid. Suspected trigger is non-adherence to insulin therapy (>1 year off lantus, novolog) and no longer following endocrinology (Dr. Fransico MichaelBrennan, last 09/2012), chart review shows patient is a T2DM as she has prior c-peptide positive but unable to meet body req of insulin, therefore not Type 1 DM. Possible underlying viral gastroenteritis as contributing trigger, with some nausea, vomiting, diarrhea, abdominal pain, however without sick contacts. On admit clinically well-appearing, normal mental status, and hemodynamically stable. Did develop worsening sinus tachycardia to  peak 180s, now sustained 140-150s. Also has been febrile overnight. - Remain on SDU today, for sinus tachy - AG closed x 3 - Start transition to SQ insulin with Lantus 25u (from home 40u) at approx 0930, followed by DC insulin drip about 2 hours later, also discontinue D5 1/2 NS at that time. Switch IVF to NS - Tolerating regular diet, without n/v - Continue BMET q 4 hr x 3 today, ensure AG stays closed - K 5.0 >> follow, likely not need to replace - A1c pending - Zofran PRN nausea - Urine Cx (pending) - Blood culture (x 1 aerobic GNR) other culture 4 hr later (pending), may have been trigger for DKA  # Sepsis with presumed source GNR Bacteremia, concern pseudomonas in uncontrolled DM2 SIRS criteria with tachycardia, fever, WBC, initially no clear source, now blood culture with early result within 24 hours x 1 aerobic GNR, other culture pending (drawn later), could be trigger for DKA. Also likely cause of sinus tachycardia, fevers, WBC. Hemodynamically stable with BP. - Check Lactic acid x2 trend - Repeat IV bolus 1L - Start empiric Zosyn per pharm 11/13 >> (pending cultures) - If 48 hours clinically resolved, and result to be negative or contaminant will discontinue  # Sinus Tachycardia, likely secondary to DKA, fever, with possible GNR Bacteremia On admit was sinus tachy to 130-140s in ED. EKG stable without acute ST-T wave changes or evidence of ischemia. No chest pain or SOB on admit. Overnight event with sinus tachy to HR 180s, then developed some chest tightness and discomfort. EKG negative for ischemic changes (but very tachy), troponins negative. Cards fellow suspected sinus tachy and not SVT, trial on dilt IV without improvement, better on metoprolol. - Suspect now with possible GNR bacteremia, this may be source - Remain in  SDU today - Fluid rehydration - Metoprolol  IV q 3 hr PRN tachycardia >160 sustained - Morphine IV 2 mg q 2 hr PRN pain (for 2 more doses) - Consider  Cardiology consult if chest pain develops again or sinus tachy is worsening  # AKI, likely pre-renal from mod dehydration, secondary to DKA - resolved Clinically dehydrated secondary to above DKA. Elevated SCr to 1.54 from baseline 0.5-0.6. Does take some ibuprofen at home. Underlying DKA resolved. - Cr down to 0.92 - IVF rehydration, continue at NS @ 150 cc/hr, taper down when tolerating better PO - Adv diet to regular - Hold nephrotoxic meds - (holding metformin, avoid NSAIDs)  # Morbid Obesity Currently 240 lb on admit with BMI 41. T2DM with severe insulin resistance. - Check lipid panel in AM - Check A1c  # Asthma, mod-persistent - without exacerbation Currentle stable. No wheezing. Stable respiratory status. No recent exacerbations. - ordered albuterol neb PRN - resume home singulair, qvar  FEN/GI: NS IVF @ 175 cc/hr (after D5 1/2 ns off with stop insulin drip), carb mod diet Prophylaxis: Lovenox SQ  Disposition: In SDU today may transfer to floor tomorrow pending sinus tachycardia resolution, AG closed and will transition to SQ insulin today, also with fevers overnight and had one blood culture positive GNR, will start empiric antibiotic coverage. Anticipate 1-3 day hospitalization.  Subjective:  Resting in bed. Feels better today than when she came in. Still slightly nauseas but hungry and ready to eat. Chest tightness and discomfort has resolved. Back less tender. No vomiting. No diarrhea or stools.  Objective: Temp:  [98.6 F (37 C)-102.2 F (39 C)] 98.6 F (37 C) (11/13 0400) Pulse Rate:  [131-185] 155 (11/13 0500) Resp:  [13-27] 23 (11/13 0500) BP: (91-154)/(40-96) 105/69 mmHg (11/13 0500) SpO2:  [90 %-99 %] 92 % (11/13 0500) Weight:  [240 lb 4 oz (108.977 kg)-248 lb 4.8 oz (112.628 kg)] 248 lb 4.8 oz (112.628 kg) (11/12 2255) Physical Exam: General: Morbidly obese, resting in bed, appears more comfortable, cooperative, NAD HEENT: mild improved dry mucus mem Chest  wall - resolved chest wall pain Cardiovascular: tachycardic, regular rhythm, no murmurs heard Respiratory: Persistent diminished breath sounds with large body habitus, mostly CTAB. No tachypnea. Skin: warm, moist Neuro: Awake, alert, oriented  Laboratory:  Recent Labs Lab 06/20/15 1513  WBC 17.3*  HGB 12.1  HCT 37.3  PLT 274    Recent Labs Lab 06/20/15 1513 06/20/15 2208 06/21/15 06/21/15 0210  NA 122* 130* 128* 132*  K 5.1 4.3 4.3 4.1  CL 89* 101 99* 106  CO2 16* 19* 17* 18*  BUN CREATININE 1.54* 1.07* 1.09* 1.00  CALCIUM 9.0 7.8* 7.9* 7.8*  PROT 8.4*  --   --   --   BILITOT 1.2  --   --   --   ALKPHOS 101  --   --   --   ALT 15  --   --   --   AST 20  --   --   --   GLUCOSE 571* 301* 299* 214*   Beta-hydroxybutyric acid - 5.06 (high)  VBG pH 7.179 (low), pCO2 45.6, pO2 34, bicarb 17  Lipase 41  UA - cloudy, rare bacteria, moderate bili, granular cast, glucose >1000, large hgb, >80 ketones, neg leuks, neg nitrite, protein 100, RBC 11-20, Spec grav 1.033 (elevated), WBC 7-10  Urine Pregnancy - negative  Urine Culture - pending  Blood Cultures x 2 - (11/12>>) GNR  x 1 tube (aerobic) - other culture pending.  Imaging/Diagnostic Tests:  11/12 CXR Portable 1v FINDINGS: Heart size and mediastinal contours are within normal limits. Both lungs are clear. Visualized skeletal structures are unremarkable. IMPRESSION: Negative exam.  11/12 ED EKG Sinus tachycardia with HR 147, with some repolarization in inferior leads. No evidence of acute ST-T wave ischemic changes.   Smitty Cords, DO 06/21/2015, 6:48 AM PGY-3, [facility] FPTS Intern pager: [phone], text pages welcome

## 2015-06-21 NOTE — Progress Notes (Signed)
Teaching service paged patient HR 149-166. SVT on the monitor patient states that she is light headed and nauseated. Lopressor 5 mg given IV

## 2015-06-21 NOTE — Progress Notes (Signed)
Dr Tenny Crawoss and Dr Graciela HusbandsKlein in to see patient. Patient given Adenosine 12mg  IV. Patient on monitor crash cart at bedside.  Post Rhythm ST at 126.Patient tolerated well.

## 2015-06-21 NOTE — Progress Notes (Signed)
Lantus given as per order will stop Insulin gtt in 2 hours at 1355 as per order.

## 2015-06-21 NOTE — Progress Notes (Addendum)
Called in to patient's room as patient shivering uncontrollably and complaining of being cold. Patient's heart rate 138, sinus tachycardia, Temperatutre 101, patient feels nauseas, and patient denies pain. Patients CBG 271. Paged MD to inform  2300- shivering & nausea resolved.    Ivery QualeJackson, Lyra Alaimo A, RN

## 2015-06-21 NOTE — Progress Notes (Signed)
  Echocardiogram 2D Echocardiogram has been performed.  Delcie RochENNINGTON, Shannia Jacuinde 06/21/2015, 5:32 PM

## 2015-06-21 NOTE — Procedures (Signed)
Pt recevied 12 mg adenosine followijng failed CSMassage for SVT noted to have begun 21h 11/12 Terminated now with Sinus tach reveiwed with Dr Margretta DittyPR who noted on echo that pt had large RV.  pts hx consistent with sleep apnea but also need to exclude Pul Embol

## 2015-06-22 ENCOUNTER — Encounter (HOSPITAL_COMMUNITY): Payer: Self-pay | Admitting: Radiology

## 2015-06-22 DIAGNOSIS — E1010 Type 1 diabetes mellitus with ketoacidosis without coma: Secondary | ICD-10-CM

## 2015-06-22 DIAGNOSIS — R0602 Shortness of breath: Secondary | ICD-10-CM

## 2015-06-22 LAB — BLOOD GAS, ARTERIAL
ACID-BASE DEFICIT: 10 mmol/L — AB (ref 0.0–2.0)
Bicarbonate: 14.9 mEq/L — ABNORMAL LOW (ref 20.0–24.0)
Drawn by: 313941
FIO2: 0.21
O2 Saturation: 89 %
PATIENT TEMPERATURE: 101.2
PCO2 ART: 32.2 mmHg — AB (ref 35.0–45.0)
PO2 ART: 62.3 mmHg — AB (ref 80.0–100.0)
TCO2: 15.9 mmol/L (ref 0–100)
pH, Arterial: 7.298 — ABNORMAL LOW (ref 7.350–7.450)

## 2015-06-22 LAB — CBC
HCT: 26.2 % — ABNORMAL LOW (ref 36.0–46.0)
HEMOGLOBIN: 8.7 g/dL — AB (ref 12.0–15.0)
MCH: 24.9 pg — AB (ref 26.0–34.0)
MCHC: 33.2 g/dL (ref 30.0–36.0)
MCV: 74.9 fL — ABNORMAL LOW (ref 78.0–100.0)
PLATELETS: 178 10*3/uL (ref 150–400)
RBC: 3.5 MIL/uL — ABNORMAL LOW (ref 3.87–5.11)
RDW: 14.3 % (ref 11.5–15.5)
WBC: 8.7 10*3/uL (ref 4.0–10.5)

## 2015-06-22 LAB — RAPID URINE DRUG SCREEN, HOSP PERFORMED
Amphetamines: NOT DETECTED
Barbiturates: NOT DETECTED
Benzodiazepines: NOT DETECTED
Cocaine: NOT DETECTED
OPIATES: NOT DETECTED
TETRAHYDROCANNABINOL: NOT DETECTED

## 2015-06-22 LAB — BASIC METABOLIC PANEL
Anion gap: 8 (ref 5–15)
BUN: 8 mg/dL (ref 6–20)
CHLORIDE: 104 mmol/L (ref 101–111)
CO2: 17 mmol/L — ABNORMAL LOW (ref 22–32)
CREATININE: 1.04 mg/dL — AB (ref 0.44–1.00)
Calcium: 7.4 mg/dL — ABNORMAL LOW (ref 8.9–10.3)
GFR calc non Af Amer: >60 mL/min (ref >60)
Glucose, Bld: 309 mg/dL — ABNORMAL HIGH (ref 65–99)
POTASSIUM: 3.7 mmol/L (ref 3.5–5.1)
SODIUM: 129 mmol/L — AB (ref 135–145)

## 2015-06-22 LAB — LACTIC ACID, PLASMA
LACTIC ACID, VENOUS: 1 mmol/L (ref 0.5–2.0)
Lactic Acid, Venous: 1.2 mmol/L (ref 0.5–2.0)

## 2015-06-22 LAB — GLUCOSE, CAPILLARY
GLUCOSE-CAPILLARY: 271 mg/dL — AB (ref 65–99)
GLUCOSE-CAPILLARY: 258 mg/dL — AB (ref 65–99)
Glucose-Capillary: 270 mg/dL — ABNORMAL HIGH (ref 65–99)
Glucose-Capillary: 311 mg/dL — ABNORMAL HIGH (ref 65–99)
Glucose-Capillary: 317 mg/dL — ABNORMAL HIGH (ref 65–99)
Glucose-Capillary: 279 mg/dL — ABNORMAL HIGH (ref 65–99)

## 2015-06-22 LAB — URINE CULTURE: SPECIAL REQUESTS: NORMAL

## 2015-06-22 LAB — HEMOGLOBIN A1C
Hgb A1c MFr Bld: 12.9 % — ABNORMAL HIGH (ref 4.8–5.6)
Mean Plasma Glucose: 324 mg/dL

## 2015-06-22 MED ORDER — INSULIN GLARGINE 100 UNIT/ML ~~LOC~~ SOLN
35.0000 [IU] | Freq: Every day | SUBCUTANEOUS | Status: DC
  Administered 2015-06-23: 35 [IU] via SUBCUTANEOUS
  Filled 2015-06-22: qty 0.35

## 2015-06-22 MED ORDER — METOPROLOL TARTRATE 1 MG/ML IV SOLN
2.5000 mg | INTRAVENOUS | Status: DC | PRN
  Administered 2015-06-23: 2.5 mg via INTRAVENOUS
  Filled 2015-06-22: qty 5

## 2015-06-22 NOTE — Progress Notes (Signed)
Patient Name: Brandi Robertson Date of Encounter: 06/22/2015  Principal Problem:   DKA, type 2, not at goal [facility](HCC) Active Problems:   Type 2 diabetes mellitus, uncontrolled (HCC)   Morbid obesity (HCC)   Diabetic peripheral neuropathy (HCC)   Nausea vomiting and diarrhea   Generalized abdominal pain   Asthma, moderate persistent   DKA, type 1 (HCC)   Bacteremia   Sinus tachycardia [facility](HCC)   Primary Cardiologist: Dr Tenny Crawoss  Patient Profile: [age] to female w/ hx DM, obesity, asthma, non-compliance w/ meds, but no cardiac hx, admitted 11/12 w/ N&V, abd pain, DKA, low NA+, ST, SIRS, AKI. 1/1 blood cx had G- rods. Cards saw for SVT 11/13. Has been in ST. EF 55-60%, RVEF ?depressed, repeat planned once HR lower.  SUBJECTIVE: Back pain, general malaise, room spinning (worse when she gets up), a little trouble breathing.  OBJECTIVE Filed Vitals:   06/22/15 0415 06/22/15 0500 06/22/15 0600 06/22/15 0815  BP: 104/78   132/94  Pulse: 134 121 118 129  Temp:   99.8 F (37.7 C) 101.2 F (38.4 C)  TempSrc:   Oral Oral  Resp: 24 24 25  32  Height:      Weight:      SpO2: 97% 91% 91% 98%    Intake/Output Summary (Last 24 hours) at 06/22/15 0837 Last data filed at 06/22/15 0600  Gross per 24 hour  Intake 4819.04 ml  Output   1200 ml  Net 3619.04 ml   Filed Weights   06/20/15 1440 06/20/15 2255  Weight: 240 lb 4 oz (108.977 kg) 248 lb 4.8 oz (112.628 kg)    PHYSICAL EXAM General: Well developed, well nourished, female in no acute distress. Head: Normocephalic, atraumatic.  Neck: Supple without bruits, JVD not elevated but difficult to assess 2nd body habitus. Lungs:  Resp regular and unlabored, rales bases, ?upper airway wheeze. Heart: Rapid but RRR, S1, S2, no S3, S4, or murmur; no rub. Abdomen: Soft, non-tender, non-distended, BS + x 4.  Extremities: No clubbing, cyanosis, edema.  Neuro: Alert and oriented X 3. Moves all extremities spontaneously. Psych: Normal  affect.  LABS: CBC:  Recent Labs  06/20/15 1513 06/21/15 1932  WBC 17.3* 9.0  NEUTROABS  --  6.8  HGB 12.1 9.0*  HCT 37.3 27.5*  MCV 76.0* 75.1*  PLT 274 192   Basic Metabolic Panel: Recent Labs  06/21/15  06/21/15 1230 06/22/15 0455  NA 128*  < > 129* 129*  K 4.3  < > 3.9 3.7  CL 99*  < > 105 104  CO2 17*  < > 17* 17*  GLUCOSE 299*  < > 138* 309*  BUN 14  < > 8 8  CREATININE 1.09*  < > 0.97 1.04*  CALCIUM 7.9*  < > 7.4* 7.4*  MG 2.0  --   --   --   < > = values in this interval not displayed. Liver Function Tests:  Recent Labs  06/20/15 1513  AST 20  ALT 15  ALKPHOS 101  BILITOT 1.2  PROT 8.4*  ALBUMIN 2.9*   Cardiac Enzymes:  Recent Labs  06/21/15 06/21/15 0627 06/21/15 1230  TROPONINI 0.05* <0.03 <0.03   Hemoglobin A1C:No results for input(s): HGBA1C in the last 72 hours. Fasting Lipid Panel:  Recent Labs  06/21/15 0210  CHOL 69  HDL 10*  LDLCALC 41  TRIG 88  CHOLHDL 6.9   Thyroid Function Tests:  Recent Labs  06/21/15 0210  TSH 2.135  TELE:  SVT>>ST once adenosine given  ECHO: 06/21/2015 - Left ventricle: The cavity size was normal. Wall thickness was normal. Systolic function was normal. The estimated ejection fraction was in the range of 55% to 60%. - Right ventricle: RV free wall difficult to see. RVEF appears to be depressed. The cavity size was mildly dilated. - Right atrium: The atrium was mildly dilated. - Pericardium, extracardiac: A trivial pericardial effusion was identified. - Impressions: Difficult study Acoustic windows poor and pt very tachycardic Recomm limited repeat once HR has slowed for LVEF/RVEF. Impressions: - Difficult study Acoustic windows poor and pt very tachycardic Recomm limited repeat once HR has slowed for LVEF/RVEF.  Radiology/Studies: Ct Angio Chest Pe W/cm &/or Wo Cm 06/22/2015  CLINICAL DATA:  Shortness of breath for 1 day. EXAM: CT ANGIOGRAPHY CHEST WITH CONTRAST TECHNIQUE:  Multidetector CT imaging of the chest was performed using the standard protocol during bolus administration of intravenous contrast. Multiplanar CT image reconstructions and MIPs were obtained to evaluate the vascular anatomy. CONTRAST:  100 mL Omnipaque 350 IV COMPARISON:  Chest radiograph 06/20/2015 FINDINGS: There are no filling defects in the central pulmonary arteries, the subsegmental and some of the segmental branches cannot be assessed due to contrast bolus timing and soft tissue attenuation from body habitus. The heart is normal in size. Thoracic aorta is normal in caliber. No evidence of dissection or aneurysm. No mediastinal or hilar adenopathy. No pleural or pericardial effusion. Mild breathing motion artifact. Minimal depending ground-glass opacity is likely hypoventilatory atelectasis. No consolidation to suggest pneumonia. No pulmonary mass or suspicious nodule. No acute abnormality in the included upper abdomen. There are no acute or suspicious osseous abnormalities. Review of the MIP images confirms the above findings. IMPRESSION: 1. No central pulmonary embolus. Suboptimal contrast bolus timing and body habitus mid assessment of the subsegmental and some of the segmental branches. 2. Minimal dependent atelectasis at the lung bases. Electronically Signed   By: Rubye Oaks M.D.   On: 06/22/2015 01:06   Dg Chest Portable 1 View 06/20/2015  CLINICAL DATA:  Headache and abdominal pain with nausea, vomiting and diarrhea for 2 days. Initial encounter. EXAM: PORTABLE CHEST 1 VIEW COMPARISON:  PA and lateral chest 07/14/2014. FINDINGS: Heart size and mediastinal contours are within normal limits. Both lungs are clear. Visualized skeletal structures are unremarkable. IMPRESSION: Negative exam. Electronically Signed   By: Drusilla Kanner M.D.   On: 06/20/2015 17:46     Current Medications:  . budesonide (PULMICORT) nebulizer solution  0.25 mg Nebulization BID  . enoxaparin (LOVENOX) injection   40 mg Subcutaneous Q24H  . Influenza vac split quadrivalent PF  0.5 mL Intramuscular Tomorrow-1000  . insulin aspart  0-20 Units Subcutaneous TID WC  . insulin aspart  0-5 Units Subcutaneous QHS  . insulin aspart  3 Units Subcutaneous TID WC  . insulin glargine  25 Units Subcutaneous Daily  . metoprolol tartrate  25 mg Oral TID  . montelukast  10 mg Oral QHS  . piperacillin-tazobactam (ZOSYN)  IV  3.375 g Intravenous 3 times per day   . sodium chloride Stopped (06/20/15 2357)  . sodium chloride 150 mL/hr at 06/22/15 0417    ASSESSMENT AND PLAN: Principal Problem:   DKA, type 2, not at goal [facility]) - A1c pending - per IM    SVT, now ST - Given Adenosine last pm>> ST since then - believe tachycardia 2nd underlying medical issues, should improve as she does - ?RVEF decreased on echo, see results above -  on Lopressor 25 mg tid, 1 dose given last pm - continue BB - recheck echo once improved    Anemia - will leave eval to IM - no obvious bleeding source  Otherwise, per IM Active Problems:   Type 2 diabetes mellitus, uncontrolled (HCC)   Morbid obesity (HCC)   Diabetic peripheral neuropathy (HCC)   Nausea vomiting and diarrhea   Generalized abdominal pain   Asthma, moderate persistent   DKA, type 1 (HCC)   Bacteremia   Sinus tachycardia (HCC)   Signed, Barrett, Rhonda , PA-C 8:37 AM 06/22/2015  I have personally seen and examined this patient with Theodore Demark, PA-C. I agree with the assessment and plan as outlined above. Admitted with abd pain and found to have DKA. SVT converted with adenosine. Now sinus tach this am. Echo with normal LV systolic function but enlarged RV. NO PE on CTA chest. Likely that SVT and sinus tach due to underlying metabolic condition/illness. Continue beta blocker.   MCALHANY,CHRISTOPHER 06/22/2015 9:13 AM

## 2015-06-22 NOTE — Progress Notes (Signed)
FPTS Interim Progress Note  S:Patient was somnolent earlier this AM. However, patient was seen again, and seemed to be improved. Patient was able to tell us her names, where she lived. She understood she was at the hospital because she was sick.   O: BP 103/75 mmHg  Pulse 117  Temp(Src) 99.6 F (37.6 C) (Oral)  Resp 25  Ht 5\' 4"  (1.626 m)  Wt 248 lb 4.8 oz (112.628 kg)  BMI 42.60 kg/m2  SpO2 94%  LMP 06/01/2015  Neuro exam- CN 2-12 intact, 5/5 strength in upper and lower extremities   A/P: - Recheck CBC  - Will check RPR and HIV  - trend Lactic acid  - UDS    Brandi Mayra ReelZahra Mikell, MD 06/22/2015, 3:03 PM PGY-1, [facility] Service pager [phone]

## 2015-06-22 NOTE — Care Management Note (Signed)
Case Management Note  Patient Details  Name: Brandi Robertson MRN: [id number] Date of Birth: 11/02/1995  Subjective/Objective:     Adm w dka               Action/Plan:lives w fam, pcp dr Dossie Arbourjessica jennings   Expected Discharge Date:                  Expected Discharge Plan:     In-House Referral:     Discharge planning Services     Post Acute Care Choice:    Choice offered to:     DME Arranged:    DME Agency:     HH Arranged:    HH Agency:     Status of Service:     Medicare Important Message Given:    Date Medicare IM Given:    Medicare IM give by:    Date Additional Medicare IM Given:    Additional Medicare Important Message give by:     If discussed at Long Length of Stay Meetings, dates discussed:    Additional Comments: ur review done  Hanley HaysDowell, Trea Carnegie T, RN 06/22/2015, 9:46 AM

## 2015-06-22 NOTE — Progress Notes (Addendum)
Inpatient Diabetes Program Recommendations  AACE/ADA: New Consensus Statement on Inpatient Glycemic Control (2015)  Target Ranges:  Prepandial:   less than 140 mg/dL      Peak postprandial:   less than 180 mg/dL (1-2 hours)      Critically ill patients:  140 - 180 mg/dL   Review of Glycemic Control  Diabetes history: DM 2 Outpatient Diabetes medications:  Current orders for Inpatient glycemic control: Lantus 25 units daily, resistant correction tidwc and HS scales, 3 units novolog meal coverage tidwc  Inpatient Diabetes Program Recommendations:  Patient at weight of 113 kg. With obesity, patient is most likely extremely resistant possibly requiring 0.4 units/kg which totals 45 units lantus. (Med Rec documents patient on lantus 50 units and up to 50 units Nvolog SSI/meal coverage  however I am unsure of present use-will talk with patient when alert and oriented).    Patient required 15 units this am and then 18 units ac lunch-still the glucose remains in high 200's into 300's. (Patient has not been getting meal coverage) Please consider increase in meal coverage of 6 units tidwc.  Will follow-up with patient once patient more responsive and alert to assess the actual regimen that patient has been using at home. Thank you Lenor CoffinAnn Sennie Borden, RN, MSN, CDE  Diabetes Inpatient Program Office: [phone] Pager: [phone] 8:00 am to 5:00 pm

## 2015-06-22 NOTE — Progress Notes (Signed)
   06/22/15 1412  Clinical Encounter Type  Visited With Patient not available  Visit Type Follow-up  Spiritual Encounters  Spiritual Needs Literature   Chaplain attempted to follow up with patient to complete advanced directive. Patient is still resting and mother is not present. Chaplains are available to help complete the advanced directive at a better time. Our support is available as needed.   Alda PonderAdam M Corlis Angelica, Chaplain 06/22/2015 2:13 PM

## 2015-06-22 NOTE — Progress Notes (Signed)
Nutrition Brief Note  Patient identified on the Malnutrition Screening Tool (MST) Report  Wt Readings from Last 15 Encounters:  06/20/15 248 lb 4.8 oz (112.628 kg) (99 %*, Z = 2.50)  12/14/14 256 lb 2 oz (116.178 kg) (99 %*, Z = 2.54)  10/04/12 232 lb 12.8 oz (105.597 kg) (99 %*, Z = 2.34)  08/23/12 259 lb 1.6 oz (117.527 kg) (99 %*, Z = 2.53)  06/22/12 263 lb 11.2 oz (119.614 kg) (99 %*, Z = 2.57)  05/01/12 262 lb 11.2 oz (119.16 kg) (99 %*, Z = 2.57)  03/30/12 265 lb 9.6 oz (120.475 kg) (100 %*, Z = 2.59)  03/06/12 256 lb (116.121 kg) (99 %*, Z = 2.54)  02/20/12 265 lb 8 oz (120.43 kg) (100 %*, Z = 2.60)  02/07/12 255 lb 4.8 oz (115.803 kg) (99 %*, Z = 2.54)   * Growth percentiles are based on CDC 2-20 Years data.   19 Y/O F with PMX of DM2, morbid obesity, asthma, presented to the hospital with two day hx of nausea, vomiting, abdominal pain, headache and back pain. This progressively worsened hence she decided to come to the hospital. She mentioned she was diagnosed with DM2 2 yrs ago when she was on Lantus 50 unit daily in addition to novolog and Metformin but she self D/C meds 1 yr ago when she ran out of medication and she never contacted her PCP for refills since she was feeling well. Patient stated this morning she had chest pain for few min but has been fine since then, no palpitations.  Pt receiving nursing care from multiple providers at time of visit. Unable to complete nutrition-focused physical exam, however, opt did not appear to exhibit any physical signs of malnutrition.   Wt hx reviewed. Wt changes not significant for time frame. UBE appears to range between 255-265#. Suspect wt changes may be related to uncontrolled blood sugars (last Hgb A1c: 12.9 taken on 06/21/15).   Noted care management consult order; pt has not been a PCP in several years and there is concern for pt's ability to take care of medical needs on her own. Pt may benefit from DM diet education once closer  to discharge and when family members are present.   Body mass index is 42.6 kg/(m^2). Patient meets criteria for extreme obesity, class III based on current BMI.   Current diet order is Carb modified, patient is consuming approximately n/a% of meals at this time. Labs and medications reviewed.   No nutrition interventions warranted at this time. If nutrition issues arise, please consult RD.   Manvir Prabhu A. Mayford KnifeWilliams, RD, LDN, CDE Pager: [phone] After hours Pager: [phone]

## 2015-06-22 NOTE — Progress Notes (Signed)
   06/22/15 1300  Clinical Encounter Type  Visited With Health care provider  Visit Type Other (Comment)  Referral From Nurse  Consult/Referral To Chaplain  Spiritual Encounters  Spiritual Needs Literature  Chaplain entered room with Pt sleeping; Chaplain spoke healthcare provider; Chaplain noticed that parent was not present for the AD; Chaplain will return to finalize AD

## 2015-06-22 NOTE — Progress Notes (Signed)
Family Medicine Teaching Service Daily Progress Note Intern Pager: [phone]  Patient name: Brandi Robertson Medical record number: [id number] Date of birth: 08-11-1995 Age: 19 y.o. Gender: female  Primary Care Provider: Forest BeckerJENNINGS, JESSICA LYNNE, MD Consultants: Cardiology  Code Status: Full   Pt Overview and Major Events to Date:   Assessment and Plan: Brandi Robertson is a 19 y.o. female presenting with nausea / vomiting, generalized abdominal pain for 2 days found to be in mild-moderate DKA with uncontrolled Insulin Dependent Diabetes. PMH is significant for T2DM (insulin dependent), peripheral neuropathy DM, morbid obesity, mod persistent asthma.  # DKA, mild-moderate, in setting of uncontrolled insulin dependent Type 2 Diabetes Clinically consistent with mild-mod DKA as she is dehydrated on admit with hyperglycemia to 550s, AG 17, low pH VBG, bicarb 16, significant >80 ketones in urine, and positive beta-hydroxybutyric acid. Suspected trigger is non-adherence to insulin therapy (>1 year off lantus, novolog) and no longer following endocrinology (Dr. Fransico MichaelBrennan, last 09/2012), chart review shows patient is a T2DM as she has prior c-peptide positive but unable to meet body req of insulin, therefore not Type 1 DM. - CBGs in 309, 270 - 175 ml/hr at Normal saline  - Continue Lantus 25->35 units, SSI resistant, plus HS coverage, novolog 3 units TID  - pending A1c - Zofran PRN nausea  Gram negative Bacteremia: Febrile throughout the day yesterday. Tmax 102.8, temp 101.2. CTA negative for PE . Patient with only slight epigastric ttp. UA with WBC 7-10, could be urosepsis causing gram negative Bacteremia.   - Blood cultures positive fot gram negative rods, x 1 bottles.  - Urine culture multiple species  - Patient currently on zosyn   SVT (per cardiology on 11/13), Resolved.  Patient received adenosine on 11/13 last night. Patient now with sinus tachy today, HR 129. Echo with normal LV systolic function  but enlarged RV. EF range of 55% to 60%. TSH 2.135. Tropinin negative x 3 - HR 129 - Continue Lopressor 25 mg TID per cardiology   # AKI, likely pre-renal from mod dehydration, secondary to DKA,  Clinically dehydrated secondary to above DKA. Elevated SCr to 1.54 from baseline 0.5-0.6. Does take some ibuprofen at home.  - SCr 1.04 this AM, after IVF resuscitation  - Hold nephrotoxic meds - (holding metformin, avoid NSAIDs)  # Morbid Obesity Currently 240 lb on admit with BMI 41. T2DM with severe insulin resistance. - Check lipid panel in AM - pending A1c  # Asthma, mod-persistent - without exacerbation Currentle stable. No wheezing. Stable respiratory status. No recent exacerbations. - ordered albuterol neb PRN - Will  singulair, Pulmicort   FEN/GI: Regular Diet, IVF  Prophylaxis: Lovenox SQ  Disposition: Admit to SDU   Subjective:  Patient states that she is nauseated today. She also indicates continued stomach pain. However no tenderness on palpation. Patient is still febrile this AM.  Objective: Temp:  [98.9 F (37.2 C)-102.8 F (39.3 C)] 99.8 F (37.7 C) (11/14 0600) Pulse Rate:  [93-162] 118 (11/14 0600) Resp:  [9-32] 25 (11/14 0600) BP: (91-134)/(50-91) 104/78 mmHg (11/14 0415) SpO2:  [91 %-100 %] 91 % (11/14 0600) Physical Exam: General: Patient lying in bed, NAD  Neck: Full ROM, no meningeal signs  Cardiovascular: Sinus tachy, no murmurs noted  Respiratory:CTAB, no wheezes, crackles  Abdomen: BS+, slight tenderness to palpation of epigastric region  Extremities: No lower extremity,   Laboratory:  Recent Labs Lab 06/20/15 1513 06/21/15 1932  WBC 17.3* 9.0  HGB 12.1 9.0*  HCT 37.3 27.5*  PLT 274 192    Recent Labs Lab 06/20/15 1513  06/21/15 0615 06/21/15 1230 06/22/15 0455  NA 122*  < > 131* 129* 129*  K 5.1  < > 5.0 3.9 3.7  CL 89*  < > 104 105 104  CO2 16*  < > 16* 17* 17*  BUN 18  < > CREATININE 1.54*  < > 0.92 0.97 1.04*  CALCIUM  9.0  < > 7.8* 7.4* 7.4*  PROT 8.4*  --   --   --   --   BILITOT 1.2  --   --   --   --   ALKPHOS 101  --   --   --   --   ALT 15  --   --   --   --   AST 20  --   --   --   --   GLUCOSE 571*  < > 161* 138* 309*  < > = values in this interval not displayed.   Imaging/Diagnostic Tests: Ct Angio Chest Pe W/cm &/or Wo Cm  06/22/2015  CLINICAL DATA:  Shortness of breath for 1 day. EXAM: CT ANGIOGRAPHY CHEST WITH CONTRAST TECHNIQUE: Multidetector CT imaging of the chest was performed using the standard protocol during bolus administration of intravenous contrast. Multiplanar CT image reconstructions and MIPs were obtained to evaluate the vascular anatomy. CONTRAST:  100 mL Omnipaque 350 IV COMPARISON:  Chest radiograph 06/20/2015 FINDINGS: There are no filling defects in the central pulmonary arteries, the subsegmental and some of the segmental branches cannot be assessed due to contrast bolus timing and soft tissue attenuation from body habitus. The heart is normal in size. Thoracic aorta is normal in caliber. No evidence of dissection or aneurysm. No mediastinal or hilar adenopathy. No pleural or pericardial effusion. Mild breathing motion artifact. Minimal depending ground-glass opacity is likely hypoventilatory atelectasis. No consolidation to suggest pneumonia. No pulmonary mass or suspicious nodule. No acute abnormality in the included upper abdomen. There are no acute or suspicious osseous abnormalities. Review of the MIP images confirms the above findings. IMPRESSION: 1. No central pulmonary embolus. Suboptimal contrast bolus timing and body habitus mid assessment of the subsegmental and some of the segmental branches. 2. Minimal dependent atelectasis at the lung bases. Electronically Signed   By: Rubye Oaks M.D.   On: 06/22/2015 01:06    Abrea Henle Mayra Reel, MD 06/22/2015, 7:07 AM PGY-1, [facility] FPTS Intern pager: [phone], text pages welcome

## 2015-06-22 NOTE — Progress Notes (Addendum)
Inpatient Diabetes Program Recommendations  AACE/ADA: New Consensus Statement on Inpatient Glycemic Control (2015)  Target Ranges:  Prepandial:   less than 140 mg/dL      Peak postprandial:   less than 180 mg/dL (1-2 hours)      Critically ill patients:  140 - 180 mg/dL   Review of Glycemic Control  Diabetes history: DM 2 Outpatient Diabetes medications:  Current orders for Inpatient glycemic control: Lantus 25 units daily, resistant correction tidwc and HS scales, 3 units novolog meal coverage tidwc  Inpatient Diabetes Program Recommendations: Patient at weight of 113 kg. With obesity, patient is most likely extremely resistant possibly requiring 0.4 units/kg which totals 45 units to meet basal needs. (Noted patient has been taking lantus 50 units, however I am unsure of present use-will talk with patient)     Patient now into 300's range. Last drip rate was 9.1 units/hr. Patient may need to resume the IV insulin drip and when transitioned, would increase lantus to 40 -45 units and increase meal coverage to 5-6 units tidwc.  Thank you Lenor CoffinAnn Tiesha Marich, RN, MSN, CDE  Diabetes Inpatient Program Office: [phone] Pager: [phone] 8:00 am to 5:00 pm

## 2015-06-23 DIAGNOSIS — Z794 Long term (current) use of insulin: Secondary | ICD-10-CM

## 2015-06-23 DIAGNOSIS — E1100 Type 2 diabetes mellitus with hyperosmolarity without nonketotic hyperglycemic-hyperosmolar coma (NKHHC): Secondary | ICD-10-CM

## 2015-06-23 LAB — CULTURE, BLOOD (ROUTINE X 2)

## 2015-06-23 LAB — GLUCOSE, CAPILLARY
GLUCOSE-CAPILLARY: 199 mg/dL — AB (ref 65–99)
GLUCOSE-CAPILLARY: 151 mg/dL — AB (ref 65–99)
Glucose-Capillary: 263 mg/dL — ABNORMAL HIGH (ref 65–99)
Glucose-Capillary: 207 mg/dL — ABNORMAL HIGH (ref 65–99)
Glucose-Capillary: 170 mg/dL — ABNORMAL HIGH (ref 65–99)

## 2015-06-23 LAB — IRON AND TIBC
Iron: 10 ug/dL — ABNORMAL LOW (ref 28–170)
SATURATION RATIOS: 4 % — AB (ref 10.4–31.8)
TIBC: 234 ug/dL — ABNORMAL LOW (ref 250–450)
UIBC: 224 ug/dL

## 2015-06-23 LAB — RPR, QUANT+TP ABS (REFLEX): T Pallidum Abs: POSITIVE — AB

## 2015-06-23 LAB — RPR: RPR Ser Ql: REACTIVE — AB

## 2015-06-23 LAB — VITAMIN B12: Vitamin B-12: 3056 pg/mL — ABNORMAL HIGH (ref 180–914)

## 2015-06-23 LAB — BASIC METABOLIC PANEL
ANION GAP: 5 (ref 5–15)
BUN: 7 mg/dL (ref 6–20)
CHLORIDE: 110 mmol/L (ref 101–111)
CO2: 19 mmol/L — AB (ref 22–32)
Calcium: 7 mg/dL — ABNORMAL LOW (ref 8.9–10.3)
Creatinine, Ser: 0.89 mg/dL (ref 0.44–1.00)
GFR calc non Af Amer: >60 mL/min (ref >60)
GLUCOSE: 196 mg/dL — AB (ref 65–99)
Potassium: 3.2 mmol/L — ABNORMAL LOW (ref 3.5–5.1)
Sodium: 134 mmol/L — ABNORMAL LOW (ref 135–145)

## 2015-06-23 LAB — CBC
HEMATOCRIT: 26 % — AB (ref 36.0–46.0)
HEMOGLOBIN: 8.5 g/dL — AB (ref 12.0–15.0)
MCH: 24.2 pg — ABNORMAL LOW (ref 26.0–34.0)
MCHC: 32.7 g/dL (ref 30.0–36.0)
MCV: 74.1 fL — AB (ref 78.0–100.0)
Platelets: 222 10*3/uL (ref 150–400)
RBC: 3.51 MIL/uL — ABNORMAL LOW (ref 3.87–5.11)
RDW: 14.5 % (ref 11.5–15.5)
WBC: 10.3 10*3/uL (ref 4.0–10.5)

## 2015-06-23 LAB — URINE CULTURE

## 2015-06-23 LAB — RETICULOCYTES
RBC.: 3.55 MIL/uL — AB (ref 3.87–5.11)
RETIC CT PCT: 0.6 % (ref 0.4–3.1)
Retic Count, Absolute: 21.3 10*3/uL (ref 19.0–186.0)

## 2015-06-23 LAB — FOLATE: Folate: 21.9 ng/mL (ref >5.9)

## 2015-06-23 LAB — FERRITIN: Ferritin: 376 ng/mL — ABNORMAL HIGH (ref 11–307)

## 2015-06-23 LAB — HIV ANTIBODY (ROUTINE TESTING W REFLEX): HIV Screen 4th Generation wRfx: NONREACTIVE

## 2015-06-23 MED ORDER — METOPROLOL TARTRATE 25 MG PO TABS: 25.0000 mg | ORAL_TABLET | Freq: Three times a day (TID) | ORAL | Status: DC

## 2015-06-23 MED ORDER — INSULIN GLARGINE 100 UNIT/ML ~~LOC~~ SOLN
45.0000 [IU] | Freq: Every day | SUBCUTANEOUS | Status: DC
  Administered 2015-06-24: 45 [IU] via SUBCUTANEOUS
  Filled 2015-06-23: qty 0.45

## 2015-06-23 MED ORDER — SODIUM CHLORIDE 0.9 % IV SOLN
510.0000 mg | Freq: Once | INTRAVENOUS | Status: AC
  Administered 2015-06-23: 510 mg via INTRAVENOUS
  Filled 2015-06-23: qty 17

## 2015-06-23 MED ORDER — CEFAZOLIN SODIUM 1-5 GM-% IV SOLN
1.0000 g | Freq: Three times a day (TID) | INTRAVENOUS | Status: DC
  Administered 2015-06-23 – 2015-06-24 (×2): 1 g via INTRAVENOUS
  Filled 2015-06-23 (×4): qty 50

## 2015-06-23 MED ORDER — POTASSIUM CHLORIDE CRYS ER 20 MEQ PO TBCR
40.0000 meq | EXTENDED_RELEASE_TABLET | Freq: Once | ORAL | Status: AC
  Administered 2015-06-23: 40 meq via ORAL
  Filled 2015-06-23: qty 2

## 2015-06-23 NOTE — Progress Notes (Addendum)
ANTIBIOTIC CONSULT NOTE - INITIAL  Pharmacy Consult for cefazolin Indication: ecoli bacteremia  Allergies  Allergen Reactions  . Bee Venom Anaphylaxis and Swelling  . Strawberry Extract Hives and Swelling    Patient Measurements: Height:  (162.6 cm) Weight: 248 lb 4.8 oz (112.628 kg) IBW/kg (Calculated) : 54.7  Vital Signs: Temp: 98.2 F (36.8 C) (11/15 1131) Temp Source: Oral (11/15 1131) BP: 131/86 mmHg (11/15 1131) Pulse Rate: 111 (11/15 1131) Intake/Output from previous day: 11/14 0701 - 11/15 0700 In: 5835.8 [P.O.:1510; I.V.:4213.3; IV Piggyback:112.5] Out: 2600 [Urine:2600] Intake/Output from this shift: Total I/O In: 175 [I.V.:175] Out: -   Labs:  Recent Labs  06/20/15 1513  06/21/15 0615 06/21/15 1230 06/21/15 1932 06/22/15 0455 06/22/15 1305  WBC 17.3*  --   --   --  9.0  --  8.7  HGB 12.1  --   --   --  9.0*  --  8.7*  PLT 274  --   --   --  192  --  178  CREATININE 1.54*  < > 0.92 0.97  --  1.04*  --   < > = values in this interval not displayed. Estimated Creatinine Clearance: 107 mL/min (by C-G formula based on Cr of 1.04). No results for input(s): VANCOTROUGH, VANCOPEAK, VANCORANDOM, GENTTROUGH, GENTPEAK, GENTRANDOM, TOBRATROUGH, TOBRAPEAK, TOBRARND, AMIKACINPEAK, AMIKACINTROU, AMIKACIN in the last 72 hours.   Microbiology: Recent Results (from the past 720 hour(s))  Urine culture     Status: None   Collection Time: 06/20/15  2:56 PM  Result Value Ref Range Status   Specimen Description URINE, CLEAN CATCH  Final   Special Requests Normal  Final   Culture MULTIPLE SPECIES PRESENT, SUGGEST RECOLLECTION  Final   Report Status 06/22/2015 FINAL  Final  Culture, blood (routine x 2)     Status: None   Collection Time: 06/20/15  7:50 PM  Result Value Ref Range Status   Specimen Description BLOOD LEFT HAND  Final   Special Requests BOTTLES DRAWN AEROBIC AND ANAEROBIC 5CC  Final   Culture  Setup Time   Final    GRAM NEGATIVE RODS AEROBIC  BOTTLE ONLY CRITICAL RESULT CALLED TO, READ BACK BY AND VERIFIED WITH: P SHELTON,RN AT 0908 06/21/15 BY L BENFIELD    Culture ESCHERICHIA COLI  Final   Report Status 06/23/2015 FINAL  Final   Organism ID, Bacteria ESCHERICHIA COLI  Final      Susceptibility   Escherichia coli - MIC*    AMPICILLIN >=32 RESISTANT Resistant     CEFAZOLIN <=4 SENSITIVE Sensitive     CEFEPIME <=1 SENSITIVE Sensitive     CEFTAZIDIME <=1 SENSITIVE Sensitive     CEFTRIAXONE <=1 SENSITIVE Sensitive     CIPROFLOXACIN <=0.25 SENSITIVE Sensitive     GENTAMICIN <=1 SENSITIVE Sensitive     IMIPENEM <=0.25 SENSITIVE Sensitive     TRIMETH/SULFA >=320 RESISTANT Resistant     AMPICILLIN/SULBACTAM 16 INTERMEDIATE Intermediate     PIP/TAZO <=4 SENSITIVE Sensitive     * ESCHERICHIA COLI  MRSA PCR Screening     Status: None   Collection Time: 06/20/15 11:12 PM  Result Value Ref Range Status   MRSA by PCR NEGATIVE NEGATIVE Final    Comment:        The GeneXpert MRSA Assay (FDA approved for NASAL specimens only), is one component of a comprehensive MRSA colonization surveillance program. It is not intended to diagnose MRSA infection nor to guide or monitor treatment for MRSA infections.  Culture, blood (routine x 2)     Status: None (Preliminary result)   Collection Time: 06/21/15 12:00 AM  Result Value Ref Range Status   Specimen Description BLOOD LEFT HAND  Final   Special Requests IN PEDIATRIC BOTTLE 2CC  Final   Culture NO GROWTH 1 DAY  Final   Report Status PENDING  Incomplete  Culture, Urine     Status: None (Preliminary result)   Collection Time: 06/22/15  8:54 PM  Result Value Ref Range Status   Specimen Description URINE, CLEAN CATCH  Final   Special Requests NONE  Final   Culture NO GROWTH < 24 HOURS  Final   Report Status PENDING  Incomplete    Medical History: Past Medical History  Diagnosis Date  . Asthma   . Diabetes mellitus type I (HCC)   . Diabetes mellitus type II   . Menorrhagia  with regular cycle   . Obesity    Assessment: 19 y.o. female presenting with nausea / vomiting, generalized abdominal pain for 2 days found to be in mild-moderate DKA with uncontrolled Insulin Dependent Diabetes.  Patient with 1/2 blood cultures positive for pan-sensitive e.coli. She has received zosyn for the past two days, sensitivities came back today and she was changed to ancef.  Tmax 101.2, wbc is normal at 8.7. SCr normal.  Goal of Therapy:  Eradication of infection  Plan:  D/c zosyn Ancef 1g q8 hours Pharmacy to s/off and follow peripherally as not dose adjustments are expected  Sheppard CoilFrank Wilson PharmD., BCPS Clinical Pharmacist Pager [phone] 06/23/2015 12:18 PM

## 2015-06-23 NOTE — Progress Notes (Signed)
nse spoke w pt's mother. Pt has pcp that medicaid has assigned her to. Pt just hasn't been to md for awhile. Pt has medicaid for md and meds.

## 2015-06-23 NOTE — Progress Notes (Addendum)
Inpatient Diabetes Program Recommendations  AACE/ADA: New Consensus Statement on Inpatient Glycemic Control (2015)  Target Ranges:  Prepandial:   less than 140 mg/dL      Peak postprandial:   less than 180 mg/dL (1-2 hours)      Critically ill patients:  140 - 180 mg/dL   Review of Glycemic Control  Inpatient Diabetes Program Recommendations:    Noted increase in lantus to 35 units. Please also increase meal coverage to 6 units tidwc as well.  Ad-spoke with patient to assess why pt has not been taking her home insulin of lantus 50 units and SSNovolog tidwc. She states she started with 40 units lantus but she was increased to 50 units. She lived in [place] at [place] time [place]. She moved to [place] approximately 3 mos ago and has been waiting to receive her [place] medicaid card. She was not able to see a doctor nor afford her insulin. Ordered Care Manager consult to assess her medicaid status and assist with medical follow-up. Patient states she has seen an "Adult Pediatric doctor" over in [place] at one time.  Pt has used insulin pens and is comfortable using them. She also states she used to check her blood sugars regularly but since her move she could not afford the meter strips. Will follow and glad to work with care management for discharge/transition needs. Spoke with mother of the patient who will come over to patient's room at 11:30 am to meet with us.  Thank you Lenor CoffinAnn Klynn Linnemann, RN, MSN, CDE  Diabetes Inpatient Program Office: [phone] Pager: [phone] 8:00 am to 5:00 pm

## 2015-06-23 NOTE — Progress Notes (Signed)
Patient Name: Brandi Robertson Date of Encounter: 06/23/2015  Principal Problem:   DKA, type 2, not at goal [facility](HCC) Active Problems:   Type 2 diabetes mellitus, uncontrolled (HCC)   Morbid obesity (HCC)   Diabetic peripheral neuropathy (HCC)   Nausea vomiting and diarrhea   Generalized abdominal pain   Asthma, moderate persistent   DKA, type 1 (HCC)   Bacteremia   Sinus tachycardia (HCC)   Diabetic ketoacidosis without coma associated with type 1 diabetes mellitus (HCC)   SOB (shortness of breath)   Primary Cardiologist: Dr Tenny Crawoss  Patient Profile: [age] to female w/ hx DM, obesity, asthma, non-compliance w/ meds, but no cardiac hx, admitted 11/12 w/ N&V, abd pain, DKA, low NA+, ST, SIRS, AKI. 1/1 blood cx had G- rods. Cards saw for SVT 11/13. Has been in ST. EF 55-60%, RVEF ?depressed, repeat planned once HR lower.  SUBJECTIVE: Feels a little better today but coughing more  OBJECTIVE Filed Vitals:   06/23/15 0300 06/23/15 0400 06/23/15 0520 06/23/15 0804  BP: 138/91   106/73  Pulse: 126 124  124  Temp: 99.2 F (37.3 C)   98.1 F (36.7 C)  TempSrc: Oral   Oral  Resp: 32 29 25 28   Height:      Weight:      SpO2: 91% 90%      Intake/Output Summary (Last 24 hours) at 06/23/15 0854 Last data filed at 06/23/15 0800  Gross per 24 hour  Intake 5200.83 ml  Output   2600 ml  Net 2600.83 ml   Filed Weights   06/20/15 1440 06/20/15 2255  Weight: 240 lb 4 oz (108.977 kg) 248 lb 4.8 oz (112.628 kg)    PHYSICAL EXAM General: Well developed, well nourished, female in no acute distress. Head: Normocephalic, atraumatic.  Neck: Supple without bruits, JVD not elevated. Lungs:  Resp regular and unlabored, rales bases w/ upper airway wheeze, poor inspiratory effort.  Heart: RRR, S1, S2, no S3, S4, or murmur; no rub. Abdomen: Soft, non-tender, non-distended, BS + x 4.  Extremities: No clubbing, cyanosis, edema.  Neuro: Alert and oriented X 3. Moves all extremities  spontaneously. Psych: Normal affect.  LABS: ABG    Component Value Date/Time   PHART 7.298* 06/22/2015 0955   PCO2ART 32.2* 06/22/2015 0955   PO2ART 62.3* 06/22/2015 0955   HCO3 14.9* 06/22/2015 0955   TCO2 15.9 06/22/2015 0955   ACIDBASEDEF 10.0* 06/22/2015 0955   O2SAT 89.0 06/22/2015 0955   CBC: Recent Labs  06/21/15 1932 06/22/15 1305  WBC 9.0 8.7  NEUTROABS 6.8  --   HGB 9.0* 8.7*  HCT 27.5* 26.2*  MCV 75.1* 74.9*  PLT 192 178   Basic Metabolic Panel: Recent Labs  06/21/15  06/21/15 1230 06/22/15 0455  NA 128*  < > 129* 129*  K 4.3  < > 3.9 3.7  CL 99*  < > 105 104  CO2 17*  < > 17* 17*  GLUCOSE 299*  < > 138* 309*  BUN 14  < > 8 8  CREATININE 1.09*  < > 0.97 1.04*  CALCIUM 7.9*  < > 7.4* 7.4*  MG 2.0  --   --   --   < > = values in this interval not displayed. Liver Function Tests: Recent Labs  06/20/15 1513  AST 20  ALT 15  ALKPHOS 101  BILITOT 1.2  PROT 8.4*  ALBUMIN 2.9*   Cardiac Enzymes: Recent Labs  06/21/15 06/21/15 0627 06/21/15 1230  TROPONINI  0.05* <0.03 <0.03   Hemoglobin A1C: Recent Labs  06/21/15  HGBA1C 12.9*   Fasting Lipid Panel: Recent Labs  06/21/15 0210  CHOL 69  HDL 10*  LDLCALC 41  TRIG 88  CHOLHDL 6.9   Thyroid Function Tests: Recent Labs  06/21/15 0210  TSH 2.135   Lab Results  Component Value Date   HGBA1C 12.9* 06/21/2015   TELE: ST  Radiology/Studies: Ct Angio Chest Pe W/cm &/or Wo Cm  06/22/2015  CLINICAL DATA:  Shortness of breath for 1 day. EXAM: CT ANGIOGRAPHY CHEST WITH CONTRAST TECHNIQUE: Multidetector CT imaging of the chest was performed using the standard protocol during bolus administration of intravenous contrast. Multiplanar CT image reconstructions and MIPs were obtained to evaluate the vascular anatomy. CONTRAST:  100 mL Omnipaque 350 IV COMPARISON:  Chest radiograph 06/20/2015 FINDINGS: There are no filling defects in the central pulmonary arteries, the subsegmental and some of the  segmental branches cannot be assessed due to contrast bolus timing and soft tissue attenuation from body habitus. The heart is normal in size. Thoracic aorta is normal in caliber. No evidence of dissection or aneurysm. No mediastinal or hilar adenopathy. No pleural or pericardial effusion. Mild breathing motion artifact. Minimal depending ground-glass opacity is likely hypoventilatory atelectasis. No consolidation to suggest pneumonia. No pulmonary mass or suspicious nodule. No acute abnormality in the included upper abdomen. There are no acute or suspicious osseous abnormalities. Review of the MIP images confirms the above findings. IMPRESSION: 1. No central pulmonary embolus. Suboptimal contrast bolus timing and body habitus mid assessment of the subsegmental and some of the segmental branches. 2. Minimal dependent atelectasis at the lung bases. Electronically Signed   By: Rubye Oaks M.D.   On: 06/22/2015 01:06     Current Medications:  . budesonide (PULMICORT) nebulizer solution  0.25 mg Nebulization BID  . enoxaparin (LOVENOX) injection  40 mg Subcutaneous Q24H  . Influenza vac split quadrivalent PF  0.5 mL Intramuscular Tomorrow-1000  . insulin aspart  0-20 Units Subcutaneous TID WC  . insulin aspart  0-5 Units Subcutaneous QHS  . insulin aspart  3 Units Subcutaneous TID WC  . insulin glargine  35 Units Subcutaneous Daily  . metoprolol tartrate  25 mg Oral TID  . montelukast  10 mg Oral QHS  . piperacillin-tazobactam (ZOSYN)  IV  3.375 g Intravenous 3 times per day   . sodium chloride 175 mL/hr at 06/23/15 0700    ASSESSMENT AND PLAN: Principal Problem:  DKA, type 2, not at goal [facility]) - A1c 12.9 - per IM   SVT, now ST - Given Adenosine 11/13 pm>> ST since then - believe tachycardia 2nd underlying medical issues, should improve as she does - ?RVEF decreased on echo, recheck echo once improved - on Lopressor 25 mg tid, middle dose not give yesterday 2nd SBP 98 - also on PRN  IV metoprolol, given once this am - continue BB as BP will tolerate   Anemia - will leave eval to IM - no obvious bleeding source - still trending down    Acidosis - ABG 11/14 w/ partly compensated metabolic acidosis - oxygen also low - eval per IM  Otherwise, per IM Principal Problem:   DKA, type 2, not at goal [facility]) Active Problems:   Type 2 diabetes mellitus, uncontrolled (HCC)   Morbid obesity (HCC)   Diabetic peripheral neuropathy (HCC)   Nausea vomiting and diarrhea   Generalized abdominal pain   Asthma, moderate persistent   DKA, type 1 (HCC)  Bacteremia   Sinus tachycardia (HCC)   Diabetic ketoacidosis without coma associated with type 1 diabetes mellitus (HCC)   SOB (shortness of breath)   Signed, Barrett, Rhonda , PA-C 8:54 AM 06/23/2015  I have personally seen and examined this patient with Theodore Demark, PA-C. I agree with the assessment and plan as outlined above. Admitted with abd pain and found to have DKA. SVT converted with adenosine. Now sinus tach this am. Echo with normal LV systolic function but enlarged RV. NO PE on CTA chest. Likely that SVT and sinus tach due to underlying metabolic condition/illness. Continue beta blocker.    MCALHANY,CHRISTOPHER 06/23/2015 10:19 AM

## 2015-06-23 NOTE — Progress Notes (Signed)
Family Medicine Teaching Service Daily Progress Note Intern Pager: [phone]  Patient name: Brandi Robertson Medical record number: [id number] Date of birth: May 29, 1996 Age: 19 y.o. Gender: female  Primary Care Provider: Forest BeckerJENNINGS, JESSICA LYNNE, MD Consultants: Cardiology  Code Status: Full   Pt Overview and Major Events to Date:   Assessment and Plan: Brandi ClassShaniya Garrod is a 19 y.o. female presenting with nausea / vomiting, generalized abdominal pain for 2 days found to be in mild-moderate DKA with uncontrolled Insulin Dependent Diabetes. PMH is significant for T2DM (insulin dependent), peripheral neuropathy DM, morbid obesity, mod persistent asthma.  # Resolved mild DKA, uncontrolled insulin dependent Type 2 Diabetes Mild DKA, admitted with hyperglycemia to 550s, AG 17, low pH VBG, bicarb 16, significant >80 ketones in urine, and positive beta-hydroxybutyric acid. Suspected trigger is non-adherence to insulin therapy (>1 year off lantus, novolog)  Chart review shows T2DM,  prior c-peptide positive but unable to meet body req of insulin.  - CBGs in 279, 199 - 175 ml/hr at NS  - Continue Lantus 35->45  units, SSI resistant, plus HS coverage, novolog 3 units TID (meal time)  - HA1c 12.9  - Zofran PRN nausea - HIV negative, RPR pending, UDS negative  - Lactic acid was 1.2  - Diabetes education, discussed with mother and diabetes coordinator --> appointment for education to be set up tomorrow   E.coli Bacteremia: Afebrile, RR 32-25, remains tachycardic 120s-130s.  CTA negative for PE . Patient with only slight epigastric ttp. UA with WBC 7-10, could be urosepsis causing gram negative Bacteremia.   - Blood cultures positive for E.coli x 1 bottles.  - Urine culture multiple species  - Recultured urine, not expecting to see any growth due antibiotic treatment  - Transition from Zosyn to Cefazoline  Persistent Tachycardia: HR 120-130s. Patient with SVT (per cardiology on 11/13), received adenosine  for SVT. Patient now with sinus tachy today, HR 129. Echo with normal LV systolic function but enlarged RV. EF range of 55% to 60%. TSH 2.135. Tropinin negative x 3. Persistent tachy  possibly multifactorial, patient with anemia and severe illness, hx of pulse 90s as outpatient.  - Continue Lopressor 25 mg TID per cardiology  - PRN Lopresor 2.5 mg IV q2hrs (had a single does today)  - Cardiology following   # AKI, likely pre-renal from mod dehydration, secondary to DKA,  Clinically dehydrated secondary to above DKA. Elevated SCr to 1.54 from baseline 0.5-0.6. Does take some ibuprofen at home.  - No blood work this AM, after IVF resuscitation  - Hold nephrotoxic meds - (holding metformin, avoid NSAIDs)  # Morbid Obesity Currently 240 lb on admit with BMI 41. T2DM with severe insulin resistance. - Cholesterol 69, LDL 41, HDL 10, ASCVD not able to calculate   # Asthma, mod-persistent - without exacerbation Currentle stable. No wheezing. Stable respiratory status. No recent exacerbations. - ordered albuterol neb PRN - Will  singulair, Pulmicort   FEN/GI: Regular Diet, IVF  Prophylaxis: Lovenox SQ  Disposition:  SDU   Subjective:  Patient denies any issues. She was refusing labs today. Patient was initially unresponsive to me, and then when I sternal rubbed her she became angry with me.   Objective: Temp:  [98.9 F (37.2 C)-101.2 F (38.4 C)] 99.2 F (37.3 C) (11/15 0300) Pulse Rate:  [113-135] 124 (11/15 0400) Resp:  [20-32] 25 (11/15 0520) BP: (97-154)/(68-109) 138/91 mmHg (11/15 0300) SpO2:  [90 %-99 %] 90 % (11/15 0400) Physical Exam: General: Patient lying in bed, NAD  Neck: Full ROM, no meningeal signs  Cardiovascular: Sinus tachy, no murmurs noted  Respiratory:CTAB, no wheezes, crackles  Abdomen: BS+, slight tenderness to palpation of upper quadrant  Extremities: No lower extremity,   Laboratory:  Recent Labs Lab 06/20/15 1513 06/21/15 1932 06/22/15 1305  WBC  17.3* 9.0 8.7  HGB 12.1 9.0* 8.7*  HCT 37.3 27.5* 26.2*  PLT 274 192 178    Recent Labs Lab 06/20/15 1513  06/21/15 0615 06/21/15 1230 06/22/15 0455  NA 122*  < > 131* 129* 129*  K 5.1  < > 5.0 3.9 3.7  CL 89*  < > 104 105 104  CO2 16*  < > 16* 17* 17*  BUN 18  < > CREATININE 1.54*  < > 0.92 0.97 1.04*  CALCIUM 9.0  < > 7.8* 7.4* 7.4*  PROT 8.4*  --   --   --   --   BILITOT 1.2  --   --   --   --   ALKPHOS 101  --   --   --   --   ALT 15  --   --   --   --   AST 20  --   --   --   --   GLUCOSE 571*  < > 161* 138* 309*  < > = values in this interval not displayed.   Imaging/Diagnostic Tests: No results found.  Juquan Reznick Mayra Reel, MD 06/23/2015, 7:17 AM PGY-1, [facility] Family Medicine FPTS Intern pager: [phone], text pages welcome

## 2015-06-23 NOTE — Progress Notes (Signed)
CRITICAL VALUE ALERT  Critical value received:  RPR 1:128  Date of notification:  06/23/2015  Time of notification:  1935  Critical value read back:Yes.    Nurse who received alert:  Gaspar Garbeanya Sonia Bromell  MD notified (1st page):  Dr. Ottie GlazierGunadasa  Time of first page:  [phone]  MD notified (2nd page):  Time of second page:  Responding MD:  Dr. Ottie GlazierGunadasa  Time MD responded:  [phone]

## 2015-06-23 NOTE — Progress Notes (Signed)
Patient is refusing blood work this morning. Educated on the importance of having results for appropriate treatment, but still refused.

## 2015-06-23 NOTE — Progress Notes (Signed)
   06/23/15 1000  Clinical Encounter Type  Visited With Patient  Visit Type Follow-up  Referral From Chaplain  Chaplain spoke with Pt while she was awake; Chaplain notified her of the Advance Directive that needed to be filled out; Chaplain feels hands are tied until mother comes in and help daughter to fill out paperwork; Chaplain can return when Pt mother is present

## 2015-06-24 DIAGNOSIS — A539 Syphilis, unspecified: Secondary | ICD-10-CM

## 2015-06-24 LAB — CBC
HEMATOCRIT: 27.7 % — AB (ref 36.0–46.0)
HEMOGLOBIN: 9.2 g/dL — AB (ref 12.0–15.0)
MCH: 24.9 pg — AB (ref 26.0–34.0)
MCHC: 33.2 g/dL (ref 30.0–36.0)
MCV: 75.1 fL — ABNORMAL LOW (ref 78.0–100.0)
Platelets: 240 10*3/uL (ref 150–400)
RBC: 3.69 MIL/uL — ABNORMAL LOW (ref 3.87–5.11)
RDW: 14.7 % (ref 11.5–15.5)
WBC: 11.1 10*3/uL — ABNORMAL HIGH (ref 4.0–10.5)

## 2015-06-24 LAB — BASIC METABOLIC PANEL
Anion gap: 7 (ref 5–15)
BUN: 6 mg/dL (ref 6–20)
CALCIUM: 7.3 mg/dL — AB (ref 8.9–10.3)
CHLORIDE: 112 mmol/L — AB (ref 101–111)
CO2: 20 mmol/L — AB (ref 22–32)
CREATININE: 0.83 mg/dL (ref 0.44–1.00)
GFR calc Af Amer: >60 mL/min (ref >60)
GFR calc non Af Amer: >60 mL/min (ref >60)
GLUCOSE: 224 mg/dL — AB (ref 65–99)
Potassium: 3.5 mmol/L (ref 3.5–5.1)
Sodium: 139 mmol/L (ref 135–145)

## 2015-06-24 LAB — GLUCOSE, CAPILLARY
GLUCOSE-CAPILLARY: 159 mg/dL — AB (ref 65–99)
GLUCOSE-CAPILLARY: 243 mg/dL — AB (ref 65–99)
Glucose-Capillary: 276 mg/dL — ABNORMAL HIGH (ref 65–99)
Glucose-Capillary: 191 mg/dL — ABNORMAL HIGH (ref 65–99)

## 2015-06-24 LAB — FLUORESCENT TREPONEMAL AB(FTA)-IGG-BLD: Fluorescent Treponemal Ab, IgG: REACTIVE — AB

## 2015-06-24 MED ORDER — PENICILLIN G BENZATHINE 1200000 UNIT/2ML IM SUSP
2.4000 10*6.[IU] | Freq: Once | INTRAMUSCULAR | Status: AC
Start: 2015-06-24 — End: 2015-06-24
  Administered 2015-06-24: 2.4 10*6.[IU] via INTRAMUSCULAR
  Filled 2015-06-24: qty 4

## 2015-06-24 MED ORDER — INSULIN GLARGINE 100 UNIT/ML ~~LOC~~ SOLN
53.0000 [IU] | Freq: Every day | SUBCUTANEOUS | Status: DC
  Administered 2015-06-25: 53 [IU] via SUBCUTANEOUS
  Filled 2015-06-24: qty 0.53

## 2015-06-24 MED ORDER — CEPHALEXIN 500 MG PO CAPS
500.0000 mg | ORAL_CAPSULE | Freq: Four times a day (QID) | ORAL | Status: DC
  Administered 2015-06-24 – 2015-06-25 (×6): 500 mg via ORAL
  Filled 2015-06-24: qty 1
  Filled 2015-06-24 (×2): qty 2
  Filled 2015-06-24 (×2): qty 1
  Filled 2015-06-24: qty 2
  Filled 2015-06-24: qty 1
  Filled 2015-06-24: qty 2

## 2015-06-24 MED ORDER — INSULIN GLARGINE 100 UNIT/ML ~~LOC~~ SOLN: 50.0000 [IU] | Freq: Every day | SUBCUTANEOUS | Status: DC

## 2015-06-24 NOTE — Progress Notes (Signed)
     SUBJECTIVE: No complaints.   BP 128/83 mmHg  Pulse 115  Temp(Src) 98 F (36.7 C) (Oral)  Resp 27  Ht 5\' 4"  (1.626 m)  Wt 248 lb 4.8 oz (112.628 kg)  BMI 42.60 kg/m2  SpO2 95%  LMP 06/01/2015  Intake/Output Summary (Last 24 hours) at 06/24/15 0839 Last data filed at 06/24/15 0700  Gross per 24 hour  Intake   4462 ml  Output   2601 ml  Net   1861 ml    PHYSICAL EXAM General: Well developed, well nourished, in no acute distress. Alert and oriented x 3.  Psych:  Good affect, responds appropriately Neck: No JVD. No masses noted.  Lungs: Clear bilaterally with no wheezes or rhonci noted.  Heart: RRR with no murmurs noted. Abdomen: Bowel sounds are present. Soft, non-tender.  Extremities: No lower extremity edema.   LABS: Basic Metabolic Panel:  Recent Labs  29/56/2111/15/16 1620 06/24/15 0324  NA 134* 139  K 3.2* 3.5  CL 110 112*  CO2 19* 20*  GLUCOSE 196* 224*  BUN 7 6  CREATININE 0.89 0.83  CALCIUM 7.0* 7.3*   CBC:  Recent Labs  06/21/15 1932  06/23/15 1620 06/24/15 0324  WBC 9.0  < > 10.3 11.1*  NEUTROABS 6.8  --   --   --   HGB 9.0*  < > 8.5* 9.2*  HCT 27.5*  < > 26.0* 27.7*  MCV 75.1*  < > 74.1* 75.1*  PLT 192  < > 222 240  < > = values in this interval not displayed. Cardiac Enzymes:  Recent Labs  06/21/15 1230  TROPONINI <0.03    Current Meds: . budesonide (PULMICORT) nebulizer solution  0.25 mg Nebulization BID  . cephALEXin  250 mg Oral 4 times per day  . enoxaparin (LOVENOX) injection  40 mg Subcutaneous Q24H  . Influenza vac split quadrivalent PF  0.5 mL Intramuscular Tomorrow-1000  . insulin aspart  0-20 Units Subcutaneous TID WC  . insulin aspart  0-5 Units Subcutaneous QHS  . insulin aspart  3 Units Subcutaneous TID WC  . insulin glargine  45 Units Subcutaneous Daily  . montelukast  10 mg Oral QHS     ASSESSMENT AND PLAN:  1. Sinus tachycardia, paroxysmal SVT: Admitted with abd pain and found to have DKA. SVT converted with  adenosine. Now sinus tach this am. Echo with normal LV systolic function but enlarged RV. NO PE on CTA chest. Likely that SVT and sinus tach due to underlying metabolic condition/illness. Continue beta blocker. We will sign off. She can follow up with DR. Dietrich PatesPaula Ross in our [address] [facility] office following discharge.   Shermeka Rutt  11/16/20168:39 AM

## 2015-06-24 NOTE — Progress Notes (Signed)
Inpatient Diabetes Program Recommendations  AACE/ADA: New Consensus Statement on Inpatient Glycemic Control (2015)  Target Ranges:  Prepandial:   less than 140 mg/dL      Peak postprandial:   less than 180 mg/dL (1-2 hours)      Critically ill patients:  140 - 180 mg/dL   Review of Glycemic Control  Noted plan for patient to be discharged on only lantus at 50 units. Looking at her cbg and correction and meal coverage insulin prior to meals helps control glucose immensely. Since mid-day on the 15th of November, patient has required  Both correction and meal coverage-correction ranging from 4 units to 7 units to 11 units in addition to meal coverage of 3 units tidwc. Aware that lantus is to be increased to 45 units today (with plan for 50 units at discharge), but basal insulin will not control meal coverage and correction needs alone. Pt states she has used a "SS novolog scale" before meals prior to running out of insulin in addition to her 50 units at HS. Just spoke with patient and discussed her prior regimen while on insulin. She is very clear on what she was doing and is very willing to assume that regimen which she stated as:  Lantus 50 units daily and novolog SSI to include her meal coverage as well as correction. When she was taking her insulin, she stated her SSI as the following: cbg of  60-100 mg/dL-no novolog  161-096-0101-120-5 units (to cover her meal)  121-150- 8 units  151-180-10 units  181-210- 12 units With incremental increases as her glucose ran high. She explained that she knows she has to check her cbg's and she will. She states with emphasis that lantus alone will not cover her meals and correction alone. I feel this patient is competent and able to follow her regimen.   May want to use a milder custom correction/SSI before meals that begins with 120 with 5 units and increase by 5 units for each additional 50 mg/dL rise in blood sugar. And order lantus at 50 units daily for  discharge. (Please review her glucose and insulin needed times the past 24 hrs) Have page texted FP medicine to consider novolog addition to basal regimen of 50 units. Pt states that her mother is trying to get a ride over here now. However, the unit is closed to visitors from 4 pm to 6 pm. Glad to talk with her over the phone if need be.  Thank you Lenor CoffinAnn Amarion Portell, RN, MSN, CDE  Diabetes Inpatient Program Office: [phone] Pager: [phone] 8:00 am to 5:00 pm

## 2015-06-24 NOTE — Progress Notes (Signed)
Family Medicine Teaching Service Daily Progress Note Intern Pager: [phone]  Patient name: Brandi ClassShaniya Viscardi Medical record number: [id number] Date of birth: 06/15/1996 Age: 19 y.o. Gender: female  Primary Care Provider: [facility] Consultants: Cardiology  Code Status: Full   Pt Overview and Major Events to Date:   Assessment and Plan: Brandi Robertson is a 19 y.o. female presenting with nausea / vomiting, generalized abdominal pain for 2 days found to be in mild-moderate DKA with uncontrolled Insulin Dependent Diabetes. PMH is significant for T2DM (insulin dependent), peripheral neuropathy DM, morbid obesity, mod persistent asthma.  # Resolved mild DKA, uncontrolled insulin dependent Type 2 Diabetes Mild DKA, admitted with hyperglycemia to 550s, AG 17, low pH VBG, bicarb 16, significant >80 ketones in urine, and positive beta-hydroxybutyric acid. HA1c 12.9. Lactic acid was 1.2. Suspected trigger is non-adherence to insulin therapy (>1 year off lantus, novolog) and E. coli bactermia.  Chart review shows T2DM,  prior c-peptide positive but unable to meet body req of insulin.  - Latest CBGs 151, 170, 207 - Discontinue IVFs, as patient tolerating PO.   - Continue Lantus, increasing to 50 units. Discontinue SSI and HS coverage to simplify regimen. - Consider restarting metformin 500 mg BID. - Zofran PRN nausea - HIV negative, RPR pending, UDS negative  - Diabetes education with mother today at 11:30 am, per notes  E.coli Bacteremia: Afebrile, continued tachypnea, remains tachycardic 120s.  CTA negative for PE . Patient with only slight epigastric ttp. UA with WBC 7-10, could be urosepsis causing gram negative Bacteremia.   - Blood cultures positive for E.coli x 1 bottles from 11/12. No growth in 2 days for sample taken 11/13.  - Urine culture multiple species; recultured urine with insignificant growth, likely due antibiotic treatment  - Transitioned from Zosyn  (received 7 doses) to Cefazolin 11/15.  - Switch to Keflex 250 mg q6h x 7 days  Persistent Tachycardia: HR 120-130s. Patient with SVT (per cardiology on 11/13), received adenosine for SVT. Patient still with sinus tachy today but slightly improved, HR 121. Echo with normal LV systolic function but enlarged RV. EF range of 55% to 60%. TSH 2.135. Tropinin negative x 3. Persistent tachy  possibly multifactorial, patient with anemia and severe illness, hx of pulse 90s as outpatient.  - D/c lopressor as patient is improving and potential infectious causes are being treated.  - Cardiology has signed off. Recommends follow-[facility].    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 -Patient informed of result and need for treatment with IM penicillin. -Ordered penicillin g benzathine 2.4 million units IM.  -Unable to reach mother this morning.  -Health department to talk with patient this afternoon.   # AKI, likely pre-renal from mod dehydration, secondary to DKA (resolved) Clinically dehydrated secondary to above DKA. Elevated SCr to 1.54 from baseline 0.5-0.6. Does take some ibuprofen at home.  - Cr 0.83 today - Discontinue IVFs, as patient has good PO.   # Morbid Obesity Currently 240 lb on admit with BMI 41. T2DM with severe insulin resistance. - Cholesterol 69, LDL 41, HDL 10, ASCVD not able to calculate   # Asthma, mod-persistent - without exacerbation Currentle stable. No wheezing. Stable respiratory status. No recent exacerbations. - ordered albuterol neb PRN - Will discharge with singulair, Pulmicort   FEN/GI: Regular Diet, IVF  Prophylaxis: Lovenox SQ  Disposition:  Stepdown Unit if discharged today, pending completion of diabetes  education. Will transfer to telemetry floor otherwise.   Subjective:  Patient doing well this morning, sitting up in bed eating breakfast. She denies stomach pain. She has  been trying to get a hold of her mother by phone but has been unable. She has been informed of need for IM penicillin x 3 for syphilis and that health department will be by this afternoon to ask about her sexual history.   Objective: Temp:  [98.1 F (36.7 C)-99.7 F (37.6 C)] 99.1 F (37.3 C) (11/16 0434) Pulse Rate:  [111-124] 121 (11/15 1959) Resp:  [20-30] 25 (11/16 0434) BP: (105-135)/(67-90) 118/87 mmHg (11/16 0434) SpO2:  [96 %-98 %] 96 % (11/16 0434) Physical Exam: General: Patient sitting at edge of bed, NAD Cardiovascular: Sinus tachy, S1, S2, no murmurs noted  Respiratory:CTAB, no wheezes, crackles  Abdomen: BS+, slight tenderness to palpation of right upper quadrant  MSK: FROM Extremities: Trace LE edema  Laboratory:  Recent Labs Lab 06/22/15 1305 06/23/15 1620 06/24/15 0324  WBC 8.7 10.3 11.1*  HGB 8.7* 8.5* 9.2*  HCT 26.2* 26.0* 27.7*  PLT 178 222 240    Recent Labs Lab 06/20/15 1513  06/22/15 0455 06/23/15 1620 06/24/15 0324  NA 122*  < > 129* 134* 139  K 5.1  < > 3.7 3.2* 3.5  CL 89*  < > 104 110 112*  CO2 16*  < > 17* 19* 20*  BUN 18  < > CREATININE 1.54*  < > 1.04* 0.89 0.83  CALCIUM 9.0  < > 7.4* 7.0* 7.3*  PROT 8.4*  --   --   --   --   BILITOT 1.2  --   --   --   --   ALKPHOS 101  --   --   --   --   ALT 15  --   --   --   --   AST 20  --   --   --   --   GLUCOSE 571*  < > 309* 196* 224*  < > = values in this interval not displayed.   Imaging/Diagnostic Tests: No results found.  Casey Burkitt, MD 06/24/2015, 6:33 AM PGY-1, [place] [facility] FPTS Intern pager: [phone], text pages welcome

## 2015-06-25 LAB — CBC
HCT: 27.6 % — ABNORMAL LOW (ref 36.0–46.0)
Hemoglobin: 9.3 g/dL — ABNORMAL LOW (ref 12.0–15.0)
MCH: 24.7 pg — AB (ref 26.0–34.0)
MCHC: 33.7 g/dL (ref 30.0–36.0)
MCV: 73.4 fL — ABNORMAL LOW (ref 78.0–100.0)
PLATELETS: 271 10*3/uL (ref 150–400)
RBC: 3.76 MIL/uL — AB (ref 3.87–5.11)
RDW: 15 % (ref 11.5–15.5)
WBC: 10.5 10*3/uL (ref 4.0–10.5)

## 2015-06-25 LAB — BASIC METABOLIC PANEL
ANION GAP: 11 (ref 5–15)
CO2: 20 mmol/L — ABNORMAL LOW (ref 22–32)
Calcium: 8.4 mg/dL — ABNORMAL LOW (ref 8.9–10.3)
Chloride: 114 mmol/L — ABNORMAL HIGH (ref 101–111)
Creatinine, Ser: 0.7 mg/dL (ref 0.44–1.00)
GFR calc Af Amer: >60 mL/min (ref >60)
Glucose, Bld: 189 mg/dL — ABNORMAL HIGH (ref 65–99)
POTASSIUM: 4.4 mmol/L (ref 3.5–5.1)
SODIUM: 145 mmol/L (ref 135–145)

## 2015-06-25 LAB — GLUCOSE, CAPILLARY: GLUCOSE-CAPILLARY: 142 mg/dL — AB (ref 65–99)

## 2015-06-25 MED ORDER — GLUCAGON (RDNA) 1 MG IJ KIT: PACK | INTRAMUSCULAR | Status: AC

## 2015-06-25 MED ORDER — INSULIN GLARGINE 100 UNIT/ML ~~LOC~~ SOLN: 53.0000 [IU] | Freq: Every day | SUBCUTANEOUS | Status: DC

## 2015-06-25 MED ORDER — CEPHALEXIN 500 MG PO CAPS: 500.0000 mg | ORAL_CAPSULE | Freq: Four times a day (QID) | ORAL | Status: DC

## 2015-06-25 MED ORDER — METFORMIN HCL 500 MG PO TABS: 500.0000 mg | ORAL_TABLET | Freq: Two times a day (BID) | ORAL | Status: AC

## 2015-06-25 MED ORDER — ACCU-CHEK MULTICLIX LANCETS MISC: Status: AC

## 2015-06-25 NOTE — Plan of Care (Signed)
Problem: Activity: Goal: Will verbalize the importance of balancing activity with adequate rest periods Outcome: Completed/Met Date Met:  06/25/15 P

## 2015-06-25 NOTE — Progress Notes (Signed)
Results for Brandi Robertson, Brandi Robertson (MRN [id number]) as of 06/25/2015 09:22  Ref. Range 06/24/2015 07:33 06/24/2015 11:54 06/24/2015 16:44 06/24/2015 21:27 06/25/2015 07:20  Glucose-Capillary Latest Ref Range: 65-99 mg/dL 409276 (H) 811191 (H) 914159 (H) 243 (H) 142 (H)  Fasting CBG is less than 180 mg/dl. Noted that Novolog correction scale had been discontinued. Recommend adding back Novolog MODERATE correction scale TID while in the hospital and continuing the HS scale as ordered. Will continue to follow while in hospital. Smith MinceKendra Tamiya Colello RN BSN CDE

## 2015-06-25 NOTE — Care Management Note (Addendum)
Case Management Note  Patient Details  Name: Brandi ClassShaniya Robertson MRN: [id number] Date of Birth: October 19, 1995  Subjective/Objective:   Pt admitted with DKA                 Action/Plan:  Pt lives with mom and siblings.  Per MD; pt has not been seen by PCP and has been off of insulin.  CM has benefit check performed; pt has active medicaid.  Pt has PCP listed on facesheet, pt confirmed having PCP at [facility]  CM assessed pt; asked pt why she has missed appts and was told that she was told she didn't have medicaid, CM informed pt that medicaid is active at this time.  CM asked unit secretary to arrange follow up appt with PCP post discharge, pt can get required medicaitons with medicaid.  Diabetes Coordinator spoke with mom in great detail regarding daughters diabetes management.  CM contacted mother and informed that medicaid is also active.     Expected Discharge Date:                  Expected Discharge Plan:  Home/Self Care (Pt stays at home with mom and siblings)  In-House Referral:     Discharge planning Services  CM Consult (Diabetes Coordinator)  Post Acute Care Choice:    Choice offered to:     DME Arranged:    DME Agency:     HH Arranged:    HH Agency:     Status of Service:  In process, will continue to follow  Medicare Important Message Given:    Date Medicare IM Given:    Medicare IM give by:    Date Additional Medicare IM Given:    Additional Medicare Important Message give by:     If discussed at Long Length of Stay Meetings, dates discussed:    Additional Comments: CM verified that pt has follow up appt with PCP prior to discharge, appt made by unit secretary and will appear on AVS. Cherylann ParrClaxton, Chenee Munns S, RN 06/25/2015, 10:45 AM

## 2015-06-25 NOTE — Progress Notes (Signed)
Spoke with patient's mother on the phone. Stated that she did not have any questions at this time. Had just spoken to her daughter's physician about insulin dosage.  Daughter was diagnosed with DM at age 19. Patient lives with mother. Mother sates that patient was seen by [facility] Dossie ArbourJessica Jennings [facility]  Mother also states that patient will have transportation to get to doctor when her car gets fixed today. Spoke with Lelon MastSamantha, Sports coachcase manager, about Medicaid card. She states that Medicaid card is active.  She will make follow up appointment with the [facility] and will check to make sure that patient can get her insulin. Smith MinceKendra Ruari Duggan RN BSN CDE

## 2015-06-25 NOTE — Discharge Instructions (Signed)
Brandi Robertson,  You were treated for diabetic ketoacidosis, which is a serious medical condition that happens when your blood sugars get too high. Sometimes an infection can cause sugars to get to high. Your blood had a type of bacteria that usually comes from the bladder or kidneys, so you may have had a kidney infection that spread to your blood. You were given IV antibiotics and then switched to antibiotic by mouth. Please continue the antibiotic Keflex 500 mg every 6 hours until you finish the pills.  You also had an elevated heart rate that required medication at first. However, this got better, and the medication was stopped.   For diabetes, please inject 53 units of lantus nightly and restart metformin 500 mg twice a day. I have prescribed a blood sugar monitor for you. Once you get it, please check your sugars four times a day to see how this new plan is working for you. This information will be very helpful for your primary care doctor to help you manage your diabetes.   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 You had 1 shot of penicillin for treatment but will require 2 more shots a week apart. [facility]; please call [phone] if this appointment does not work for your schedule.   For asthma, continue QVAR 2 puffs twice daily and albuterol as needed for wheezing.   Thank you and be well! Dr. Sampson GoonFitzgerald  Diabetic Ketoacidosis Diabetic ketoacidosis is a life-threatening complication of diabetes. If it is not treated, it can cause severe dehydration and organ damage and can lead to a coma or death. CAUSES This condition develops when there is not enough of the hormone insulin in the body. Insulin helps the body to break down sugar for energy. Without insulin, the body cannot break down sugar, so it breaks down fats instead. This leads to the production of  acids that are called ketones. Ketones are poisonous at high levels. This condition can be triggered by:  Stress on the body that is brought on by an illness.  Medicines that raise blood glucose levels.  Not taking diabetes medicine. SYMPTOMS Symptoms of this condition include:  Fatigue.  Weight loss.  Excessive thirst.  Light-headedness.  Fruity or sweet-smelling breath.  Excessive urination.  Vision changes.  Confusion or irritability.  Nausea.  Vomiting.  Rapid breathing.  Abdominal pain.  Feeling flushed. DIAGNOSIS This condition is diagnosed based on a medical history, a physical exam, and blood tests. You may also have a urine test that checks for ketones. TREATMENT This condition may be treated with:  Fluid replacement. This may be done to correct dehydration.  Insulin injections. These may be given through the skin or through an IV tube.  Electrolyte replacement. Electrolytes, such as potassium and sodium, may be given in pill form or through an IV tube.  Antibiotic medicines. These may be prescribed if your condition was caused by an infection. HOME CARE INSTRUCTIONS Eating and Drinking  Drink enough fluids to keep your urine clear or pale yellow.  If you cannot eat, alternate between drinking fluids with sugar (such as juice) and salty fluids (such as broth or bouillon).  If you can eat, follow your usual diet and drink sugar-free liquids, such as water. Other Instructions  Take insulin as directed by your health care provider. Do not skip insulin injections. Do not  use expired insulin.  If your blood sugar is over 240 mg/dL, monitor your urine ketones every 4-6 hours.  If you were prescribed an antibiotic medicine, finish all of it even if you start to feel better.  Rest and exercise only as directed by your health care provider.  If you get sick, call your health care provider and begin treatment quickly. Your body often needs extra  insulin to fight an illness.  Check your blood glucose levels regularly. If your blood glucose is high, drink plenty of fluids. This helps to flush out ketones. SEEK MEDICAL CARE IF:  Your blood glucose level is too high or too low.  You have ketones in your urine.  You have a fever.  You cannot eat.  You cannot tolerate fluids.  You have been vomiting for more than 2 hours.  You continue to have symptoms of this condition.  You develop new symptoms. SEEK IMMEDIATE MEDICAL CARE IF:  Your blood glucose levels continue to be high (elevated).  Your monitor reads "high" even when you are taking insulin.  You faint.  You have chest pain.  You have trouble breathing.  You have a sudden, severe headache.  You have sudden weakness in one arm or one leg.  You have sudden trouble speaking or swallowing.  You have vomiting or diarrhea that gets worse after 3 hours.  You feel severely fatigued.  You have trouble thinking.  You have abdominal pain.  You are severely dehydrated. Symptoms of severe dehydration include:  Extreme thirst.  Dry mouth.  Blue lips.  Cold hands and feet.  Rapid breathing.   This information is not intended to replace advice given to you by your health care provider. Make sure you discuss any questions you have with your health care provider.   Document Released: 07/22/2000 Document Revised: 12/09/2014 Document Reviewed: 07/02/2014 Elsevier Interactive Patient Education [employer]

## 2015-06-25 NOTE — Progress Notes (Signed)
Utilization review completed.  

## 2015-06-26 LAB — CULTURE, BLOOD (ROUTINE X 2): CULTURE: NO GROWTH

## 2015-06-26 MED ORDER — IOHEXOL 350 MG/ML SOLN
100.0000 mL | Freq: Once | INTRAVENOUS | Status: AC | PRN
Start: 2015-06-22 — End: 2015-06-22
  Administered 2015-06-22: 100 mL via INTRAVENOUS

## 2015-10-10 ENCOUNTER — Encounter (HOSPITAL_COMMUNITY): Payer: Self-pay | Admitting: Family Medicine

## 2015-10-10 ENCOUNTER — Emergency Department (HOSPITAL_COMMUNITY)
Admission: EM | Admit: 2015-10-10 | Discharge: 2015-10-10 | Disposition: A | Discharge status: Home / Self Care | Payer: Medicaid Other | Attending: Emergency Medicine | Admitting: Emergency Medicine

## 2015-10-10 DIAGNOSIS — Z794 Long term (current) use of insulin: Secondary | ICD-10-CM | POA: Diagnosis not present

## 2015-10-10 DIAGNOSIS — Z7984 Long term (current) use of oral hypoglycemic drugs: Secondary | ICD-10-CM | POA: Diagnosis not present

## 2015-10-10 DIAGNOSIS — Z7952 Long term (current) use of systemic steroids: Secondary | ICD-10-CM | POA: Insufficient documentation

## 2015-10-10 DIAGNOSIS — Z8742 Personal history of other diseases of the female genital tract: Secondary | ICD-10-CM | POA: Diagnosis not present

## 2015-10-10 DIAGNOSIS — Z792 Long term (current) use of antibiotics: Secondary | ICD-10-CM | POA: Diagnosis not present

## 2015-10-10 DIAGNOSIS — E119 Type 2 diabetes mellitus without complications: Secondary | ICD-10-CM | POA: Diagnosis not present

## 2015-10-10 DIAGNOSIS — J45909 Unspecified asthma, uncomplicated: Secondary | ICD-10-CM | POA: Diagnosis not present

## 2015-10-10 DIAGNOSIS — A599 Trichomoniasis, unspecified: Secondary | ICD-10-CM | POA: Insufficient documentation

## 2015-10-10 DIAGNOSIS — Z7951 Long term (current) use of inhaled steroids: Secondary | ICD-10-CM | POA: Diagnosis not present

## 2015-10-10 DIAGNOSIS — N760 Acute vaginitis: Secondary | ICD-10-CM | POA: Insufficient documentation

## 2015-10-10 DIAGNOSIS — Z3202 Encounter for pregnancy test, result negative: Secondary | ICD-10-CM | POA: Diagnosis not present

## 2015-10-10 DIAGNOSIS — L299 Pruritus, unspecified: Secondary | ICD-10-CM | POA: Diagnosis present

## 2015-10-10 DIAGNOSIS — B9689 Other specified bacterial agents as the cause of diseases classified elsewhere: Secondary | ICD-10-CM

## 2015-10-10 LAB — WET PREP, GENITAL
SPERM: NONE SEEN
YEAST WET PREP: NONE SEEN

## 2015-10-10 LAB — I-STAT BETA HCG BLOOD, ED (MC, WL, AP ONLY): I-stat hCG, quantitative: <5 m[IU]/mL (ref <5)

## 2015-10-10 LAB — URINALYSIS, ROUTINE W REFLEX MICROSCOPIC
Bilirubin Urine: NEGATIVE
Hgb urine dipstick: NEGATIVE
Ketones, ur: NEGATIVE mg/dL
Nitrite: NEGATIVE
PH: 5.5 (ref 5.0–8.0)
PROTEIN: NEGATIVE mg/dL
Specific Gravity, Urine: 1.038 — ABNORMAL HIGH (ref 1.005–1.030)

## 2015-10-10 LAB — URINE MICROSCOPIC-ADD ON
BACTERIA UA: NONE SEEN
RBC / HPF: NONE SEEN RBC/hpf (ref 0–5)

## 2015-10-10 MED ORDER — LIDOCAINE HCL (PF) 1 % IJ SOLN
2.0000 mL | Freq: Once | INTRAMUSCULAR | Status: AC
  Administered 2015-10-10: 2 mL via INTRADERMAL
  Filled 2015-10-10: qty 5

## 2015-10-10 MED ORDER — METRONIDAZOLE 500 MG PO TABS: 500.0000 mg | ORAL_TABLET | Freq: Two times a day (BID) | ORAL | Status: DC

## 2015-10-10 MED ORDER — DOXYCYCLINE HYCLATE 100 MG PO CAPS: 100.0000 mg | ORAL_CAPSULE | Freq: Two times a day (BID) | ORAL | Status: AC

## 2015-10-10 MED ORDER — CEFTRIAXONE SODIUM 250 MG IJ SOLR
250.0000 mg | Freq: Once | INTRAMUSCULAR | Status: AC
  Administered 2015-10-10: 250 mg via INTRAMUSCULAR
  Filled 2015-10-10: qty 250

## 2015-10-10 MED ORDER — AZITHROMYCIN 250 MG PO TABS
1000.0000 mg | ORAL_TABLET | Freq: Once | ORAL | Status: AC
  Administered 2015-10-10: 1000 mg via ORAL
  Filled 2015-10-10: qty 4

## 2015-10-10 NOTE — Discharge Instructions (Signed)
Ms. Brandi Robertson,  Nice meeting you! Please follow-up with your gynecologist. Return to the emergency department if you develop fevers, chills, have visual changes, headaches, blurred vision, abdominal pain. Feel better soon!  S. Lane HackerNicole Salima Rumer, PA-C Bacterial Vaginosis Bacterial vaginosis is a vaginal infection that occurs when the normal balance of bacteria in the vagina is disrupted. It results from an overgrowth of certain bacteria. This is the most common vaginal infection in women of childbearing age. Treatment is important to prevent complications, especially in pregnant women, as it can cause a premature delivery. CAUSES  Bacterial vaginosis is caused by an increase in harmful bacteria that are normally present in smaller amounts in the vagina. Several different kinds of bacteria can cause bacterial vaginosis. However, the reason that the condition develops is not fully understood. RISK FACTORS Certain activities or behaviors can put you at an increased risk of developing bacterial vaginosis, including:  Having a new sex partner or multiple sex partners.  Douching.  Using an intrauterine device (IUD) for contraception. Women do not get bacterial vaginosis from toilet seats, bedding, swimming pools, or contact with objects around them. SIGNS AND SYMPTOMS  Some women with bacterial vaginosis have no signs or symptoms. Common symptoms include:  Grey vaginal discharge.  A fishlike odor with discharge, especially after sexual intercourse.  Itching or burning of the vagina and vulva.  Burning or pain with urination. DIAGNOSIS  Your health care provider will take a medical history and examine the vagina for signs of bacterial vaginosis. A sample of vaginal fluid may be taken. Your health care provider will look at this sample under a microscope to check for bacteria and abnormal cells. A vaginal pH test may also be done.  TREATMENT  Bacterial vaginosis may be treated with antibiotic  medicines. These may be given in the form of a pill or a vaginal cream. A second round of antibiotics may be prescribed if the condition comes back after treatment. Because bacterial vaginosis increases your risk for sexually transmitted diseases, getting treated can help reduce your risk for chlamydia, gonorrhea, HIV, and herpes. HOME CARE INSTRUCTIONS   Only take over-the-counter or prescription medicines as directed by your health care provider.  If antibiotic medicine was prescribed, take it as directed. Make sure you finish it even if you start to feel better.  Tell all sexual partners that you have a vaginal infection. They should see their health care provider and be treated if they have problems, such as a mild rash or itching.  During treatment, it is important that you follow these instructions:  Avoid sexual activity or use condoms correctly.  Do not douche.  Avoid alcohol as directed by your health care provider.  Avoid breastfeeding as directed by your health care provider. SEEK MEDICAL CARE IF:   Your symptoms are not improving after 3 days of treatment.  You have increased discharge or pain.  You have a fever. MAKE SURE YOU:   Understand these instructions.  Will watch your condition.  Will get help right away if you are not doing well or get worse. FOR MORE INFORMATION  Centers for Disease Control and Prevention, Division of STD Prevention: SolutionApps.co.zawww.cdc.gov/std American Sexual Health Association (ASHA): www.ashastd.org    This information is not intended to replace advice given to you by your health care provider. Make sure you discuss any questions you have with your health care provider.   Document Released: 07/25/2005 Document Revised: 08/15/2014 Document Reviewed: 03/06/2013 Elsevier Interactive Patient Education 2016 Elsevier  Inc. ° °

## 2015-10-10 NOTE — ED Provider Notes (Signed)
CSN: [id number]     Arrival date & time 10/10/15  1442 History   First MD Initiated Contact with Patient 10/10/15 1548     Chief Complaint  Patient presents with  . Vaginal Itching   HPI   Brandi Robertson is a 20 y.o. female PMH significant for diabetes, obesity presenting with a 1 month history of vaginal itching and swelling. She endorses malodorous discharge as well. She denies fevers, chills, chest pain, shortness of breath, headaches, abdominal pain, nausea, vomiting, dysuria.  Past Medical History  Diagnosis Date  . Asthma   . Diabetes mellitus type I (HCC)   . Diabetes mellitus type II   . Menorrhagia with regular cycle   . Obesity    Past Surgical History  Procedure Laterality Date  . Dental surgery     Family History  Problem Relation Age of Onset  . Obesity Mother   . Hypertension Maternal Grandmother   . Cancer Maternal Grandmother   . Diabetes Maternal Grandfather   . Kidney disease Maternal Grandfather   . Obesity Maternal Grandfather   . Diabetes Paternal Grandmother   . Obesity Paternal Grandmother   . Obesity Father   . Obesity Sister   . Obesity Brother   . Diabetes Maternal Aunt   . Obesity Maternal Aunt   . Diabetes Paternal Aunt    Social History  Substance Use Topics  . Smoking status: Passive Smoke Exposure - Never Smoker  . Smokeless tobacco: None  . Alcohol Use: No   OB History    No data available     Review of Systems  Ten systems are reviewed and are negative for acute change except as noted in the HPI   Allergies  Bee venom and Strawberry extract  Home Medications   Prior to Admission medications   Medication Sig Start Date End Date Taking? Authorizing Provider  albuterol (PROVENTIL HFA;VENTOLIN HFA) 108 (90 BASE) MCG/ACT inhaler Inhale 2 puffs into the lungs every 6 (six) hours as needed for wheezing or shortness of breath. 08/22/11   Adlih Moreno-Coll, MD  albuterol (PROVENTIL) (2.5 MG/3ML) 0.083% nebulizer solution Take 3 mLs  (2.5 mg total) by nebulization every 6 (six) hours as needed for wheezing or shortness of breath. 12/01/13   Riki SheerMichelle G Young, PA-C  beclomethasone (QVAR) 40 MCG/ACT inhaler Inhale 2 puffs into the lungs 2 (two) times daily.    Historical Provider, MD  cephALEXin (KEFLEX) 500 MG capsule Take 1 capsule (500 mg total) by mouth every 6 (six) hours. 06/25/15   Hillary Percell BostonMoen Fitzgerald, MD  doxycycline (VIBRAMYCIN) 100 MG capsule Take 1 capsule (100 mg total) by mouth 2 (two) times daily. 10/10/15   Melton KrebsSamantha Nicole Dreama Kuna, PA-C  glucagon 1 MG injection Use for Severe Hypoglycemia . Inject 1 mg intramuscularly if unresponsive, unable to swallow, unconscious and/or has seizure 06/25/15 06/24/16  Casey BurkittHillary Moen Fitzgerald, MD  ibuprofen (ADVIL,MOTRIN) 200 MG tablet Take 200 mg by mouth every 6 (six) hours as needed. 02/20/12   Historical Provider, MD  insulin glargine (LANTUS) 100 UNIT/ML injection Inject 0.53 mLs (53 Units total) into the skin daily. 06/25/15   Hillary Percell BostonMoen Fitzgerald, MD  Lancets (ACCU-CHEK MULTICLIX) lancets Use as instructed 06/25/15   Casey BurkittHillary Moen Fitzgerald, MD  loratadine (CLARITIN) 10 MG tablet Take 10 mg by mouth daily.    Historical Provider, MD  metFORMIN (GLUCOPHAGE) 500 MG tablet Take 1 tablet (500 mg total) by mouth 2 (two) times daily with a meal. 06/25/15   Hillary WanamingoMoen Fitzgerald,  MD  metroNIDAZOLE (FLAGYL) 500 MG tablet Take 1 tablet (500 mg total) by mouth 2 (two) times daily. 10/10/15   Melton Krebs, PA-C  montelukast (SINGULAIR) 10 MG tablet Take 1 tablet (10 mg total) by mouth at bedtime. 08/22/11   Adlih Moreno-Coll, MD  triamcinolone (KENALOG) 0.025 % ointment Apply 1 application topically 2 (two) times daily. 06/08/15   Linna Hoff, MD   BP 125/85 mmHg  Pulse 97  Temp(Src) 97.8 F (36.6 C) (Oral)  Resp 18  SpO2 100%  LMP 08/31/2015 Physical Exam  Constitutional: She appears well-developed and well-nourished. No distress.  Morbid obesity  HENT:  Head:  Normocephalic and atraumatic.  Mouth/Throat: Oropharynx is clear and moist. No oropharyngeal exudate.  Eyes: Conjunctivae are normal. Pupils are equal, round, and reactive to light. Right eye exhibits no discharge. Left eye exhibits no discharge. No scleral icterus.  Neck: No tracheal deviation present.  Cardiovascular: Normal rate, regular rhythm, normal heart sounds and intact distal pulses.  Exam reveals no gallop and no friction rub.   No murmur heard. Pulmonary/Chest: Effort normal and breath sounds normal. No respiratory distress. She has no wheezes. She has no rales. She exhibits no tenderness.  Abdominal: Soft. Bowel sounds are normal. She exhibits no distension and no mass. There is no tenderness. There is no rebound and no guarding.  Genitourinary:  Pelvic exam: normal external genitalia, vulva, erythematous vagina, cervix, uterus and adnexa. Chaperone present.    Musculoskeletal: She exhibits no edema.  Lymphadenopathy:    She has no cervical adenopathy.  Neurological: She is alert. Coordination normal.  Skin: Skin is warm and dry. Rash noted. She is not diaphoretic. No erythema.  Erythematous, edematous, shiny labia majora  Psychiatric: She has a normal mood and affect. Her behavior is normal.  Nursing note and vitals reviewed.   ED Course  Procedures  Labs Review Labs Reviewed  WET PREP, GENITAL - Abnormal; Notable for the following:    Trich, Wet Prep PRESENT (*)    Clue Cells Wet Prep HPF POC PRESENT (*)    WBC, Wet Prep HPF POC MANY (*)    All other components within normal limits  URINALYSIS, ROUTINE W REFLEX MICROSCOPIC (NOT AT [facility]) - Abnormal; Notable for the following:    Specific Gravity, Urine 1.038 (*)    Glucose, UA >1000 (*)    Leukocytes, UA MODERATE (*)    All other components within normal limits  URINE MICROSCOPIC-ADD ON - Abnormal; Notable for the following:    Squamous Epithelial / LPF 6-30 (*)    All other components within normal limits  RPR   HIV ANTIBODY (ROUTINE TESTING)  I-STAT BETA HCG BLOOD, ED ([facility], [facility], [facility] ONLY)  GC/CHLAMYDIA PROBE AMP ([place]) NOT AT [facility]   MDM   Final diagnoses:  Bacterial vaginosis  Trichomonas infection   Urinalysis with glucosuria and moderate leukocytes. Wet prep demonstrates bacterial vaginosis and trichomonas vaginalis. Erythematous labia majora , consistent with dermatitis versus lesions consistent with lichen simplex. Patient to be discharged with instructions to follow up with OBGYN. Discussed importance of using protection when sexually active. Pt understands that they have GC/Chlamydia cultures pending and that they will need to inform all sexual partners if results return positive. Pt has been treated prophylacticly with azithromycin and rocephin due to pts history, pelvic exam, and wet prep with increased WBCs. Will treat empirically for PID. Pt has also been treated with flagyl for Bacterial Vaginosis. Pt has been advised to not drink alcohol  while on this medication.   Melton Krebs, PA-C 10/10/15 1735  Mancel Bale, MD 10/11/15 [phone]

## 2015-10-10 NOTE — ED Notes (Signed)
Pt here for vaginal itching and swelling.

## 2015-10-10 NOTE — ED Notes (Signed)
Pt ambulates independently and with steady gait at time of discharge. Discharge instructions and follow up information reviewed with patient. No other questions or concerns voiced at this time. RX x 2 given. 

## 2015-10-11 LAB — HIV ANTIBODY (ROUTINE TESTING W REFLEX): HIV SCREEN 4TH GENERATION: NONREACTIVE

## 2015-10-12 LAB — GC/CHLAMYDIA PROBE AMP (~~LOC~~) NOT AT ARMC
CHLAMYDIA, DNA PROBE: NEGATIVE
NEISSERIA GONORRHEA: NEGATIVE

## 2015-10-12 LAB — RPR: RPR Ser Ql: REACTIVE — AB

## 2015-10-12 LAB — RPR, QUANT+TP ABS (REFLEX)
Rapid Plasma Reagin, Quant: 1:16 {titer} — ABNORMAL HIGH
TREPONEMA PALLIDUM AB: POSITIVE — AB

## 2015-10-13 ENCOUNTER — Telehealth (HOSPITAL_BASED_OUTPATIENT_CLINIC_OR_DEPARTMENT_OTHER): Payer: Self-pay | Admitting: Emergency Medicine

## 2015-10-13 NOTE — Telephone Encounter (Signed)
Chart handoff to EDP for treatment plan for + RPR , RPR titer of 1:16

## 2015-10-14 ENCOUNTER — Telehealth (HOSPITAL_BASED_OUTPATIENT_CLINIC_OR_DEPARTMENT_OTHER): Payer: Self-pay | Admitting: Emergency Medicine

## 2015-10-14 NOTE — Telephone Encounter (Signed)
Post ED Visit - Positive Culture Follow-up: Successful Patient Follow-Up  Culture assessed and recommendations reviewed by: []  Enzo BiNathan Batchelder, Pharm.D. []  Celedonio MiyamotoJeremy Frens, Pharm.D., BCPS []  Garvin FilaMike Maccia, Pharm.D. []  Georgina PillionElizabeth Martin, Pharm.D., BCPS []  MarionMinh Pham, [address] Rainbow BoulevardPharm.D., BCPS, AAHIVP []  Estella HuskMichelle Turner, Pharm.D., BCPS, AAHIVP []  Tennis Mustassie Stewart, Pharm.D. []  Sherle Poeob Vincent, [place]D.  Positive RPR culture  []  Patient discharged without antimicrobial prescription and treatment is now indicated []  Organism is resistant to prescribed ED discharge antimicrobial []  Patient with positive blood cultures  Changes discussed with ED provider: Effie ShyWentz New antibiotic prescription Bicillin 2.4 million units IM x 1  attempting to contact patient    Berle MullMiller, Neytiri Asche 10/14/2015, 12:56 PM

## 2015-10-22 ENCOUNTER — Encounter (HOSPITAL_COMMUNITY): Payer: Self-pay | Admitting: Emergency Medicine

## 2015-10-22 ENCOUNTER — Emergency Department (HOSPITAL_COMMUNITY)
Admission: EM | Admit: 2015-10-22 | Discharge: 2015-10-23 | Disposition: A | Discharge status: Home / Self Care | Payer: Medicaid Other | Attending: Emergency Medicine | Admitting: Emergency Medicine

## 2015-10-22 DIAGNOSIS — E1165 Type 2 diabetes mellitus with hyperglycemia: Secondary | ICD-10-CM | POA: Insufficient documentation

## 2015-10-22 DIAGNOSIS — J45909 Unspecified asthma, uncomplicated: Secondary | ICD-10-CM | POA: Insufficient documentation

## 2015-10-22 DIAGNOSIS — R110 Nausea: Secondary | ICD-10-CM | POA: Insufficient documentation

## 2015-10-22 DIAGNOSIS — E669 Obesity, unspecified: Secondary | ICD-10-CM | POA: Diagnosis not present

## 2015-10-22 DIAGNOSIS — M549 Dorsalgia, unspecified: Secondary | ICD-10-CM | POA: Insufficient documentation

## 2015-10-22 DIAGNOSIS — R197 Diarrhea, unspecified: Secondary | ICD-10-CM | POA: Diagnosis not present

## 2015-10-22 DIAGNOSIS — R1084 Generalized abdominal pain: Secondary | ICD-10-CM | POA: Diagnosis present

## 2015-10-22 DIAGNOSIS — F17210 Nicotine dependence, cigarettes, uncomplicated: Secondary | ICD-10-CM | POA: Diagnosis not present

## 2015-10-22 LAB — URINALYSIS, ROUTINE W REFLEX MICROSCOPIC
Bilirubin Urine: NEGATIVE
HGB URINE DIPSTICK: NEGATIVE
Ketones, ur: NEGATIVE mg/dL
Nitrite: NEGATIVE
PROTEIN: NEGATIVE mg/dL
SPECIFIC GRAVITY, URINE: 1.039 — AB (ref 1.005–1.030)
pH: 6 (ref 5.0–8.0)

## 2015-10-22 LAB — COMPREHENSIVE METABOLIC PANEL
ALBUMIN: 3.4 g/dL — AB (ref 3.5–5.0)
ALT: 13 U/L — AB (ref 14–54)
AST: 28 U/L (ref 15–41)
Alkaline Phosphatase: 77 U/L (ref 38–126)
Anion gap: 12 (ref 5–15)
BUN: 10 mg/dL (ref 6–20)
CHLORIDE: 95 mmol/L — AB (ref 101–111)
CO2: 26 mmol/L (ref 22–32)
CREATININE: 0.85 mg/dL (ref 0.44–1.00)
Calcium: 9.2 mg/dL (ref 8.9–10.3)
GFR calc non Af Amer: >60 mL/min (ref >60)
GLUCOSE: 594 mg/dL — AB (ref 65–99)
Potassium: 5 mmol/L (ref 3.5–5.1)
SODIUM: 133 mmol/L — AB (ref 135–145)
Total Bilirubin: 1 mg/dL (ref 0.3–1.2)
Total Protein: 6.4 g/dL — ABNORMAL LOW (ref 6.5–8.1)

## 2015-10-22 LAB — LIPASE, BLOOD: LIPASE: 31 U/L (ref 11–51)

## 2015-10-22 LAB — CBC
HCT: 39.7 % (ref 36.0–46.0)
Hemoglobin: 12.7 g/dL (ref 12.0–15.0)
MCH: 26.2 pg (ref 26.0–34.0)
MCHC: 32 g/dL (ref 30.0–36.0)
MCV: 81.9 fL (ref 78.0–100.0)
PLATELETS: 327 10*3/uL (ref 150–400)
RBC: 4.85 MIL/uL (ref 3.87–5.11)
RDW: 12.7 % (ref 11.5–15.5)
WBC: 5.8 10*3/uL (ref 4.0–10.5)

## 2015-10-22 LAB — URINE MICROSCOPIC-ADD ON

## 2015-10-22 LAB — POC URINE PREG, ED: Preg Test, Ur: NEGATIVE

## 2015-10-22 LAB — CBG MONITORING, ED: Glucose-Capillary: 461 mg/dL — ABNORMAL HIGH (ref 65–99)

## 2015-10-22 NOTE — ED Notes (Addendum)
EDP - Dr. Roselyn BeringJ. Knapp / charge nurse - Irving BurtonEmily notified on pt.'s condition . Pt. will be moved to the next available bed .

## 2015-10-22 NOTE — ED Notes (Signed)
Pt called for room and no answer in waiting room.

## 2015-10-22 NOTE — ED Notes (Addendum)
Pt. reports generalized abdominal pain with left lower back pain , nausea and occasional diarrhea , denies fever or chills. Hyperglycemic at triage - pt. stated she has not taken her insulin and oral hypoglycemic for several weeks.

## 2015-10-22 NOTE — ED Notes (Signed)
Pt no answer in waiting room x2.

## 2015-10-23 ENCOUNTER — Other Ambulatory Visit: Payer: Self-pay | Admitting: Internal Medicine

## 2015-10-23 ENCOUNTER — Emergency Department (HOSPITAL_COMMUNITY)
Admission: EM | Admit: 2015-10-23 | Discharge: 2015-10-23 | Disposition: A | Discharge status: Home / Self Care | Payer: Medicaid Other | Attending: Emergency Medicine | Admitting: Emergency Medicine

## 2015-10-23 ENCOUNTER — Emergency Department (HOSPITAL_COMMUNITY): Payer: Medicaid Other

## 2015-10-23 ENCOUNTER — Encounter (HOSPITAL_COMMUNITY): Payer: Self-pay | Admitting: Nurse Practitioner

## 2015-10-23 DIAGNOSIS — R5383 Other fatigue: Secondary | ICD-10-CM | POA: Diagnosis not present

## 2015-10-23 DIAGNOSIS — Z794 Long term (current) use of insulin: Secondary | ICD-10-CM | POA: Insufficient documentation

## 2015-10-23 DIAGNOSIS — R5381 Other malaise: Secondary | ICD-10-CM | POA: Insufficient documentation

## 2015-10-23 DIAGNOSIS — Z8742 Personal history of other diseases of the female genital tract: Secondary | ICD-10-CM | POA: Insufficient documentation

## 2015-10-23 DIAGNOSIS — M2550 Pain in unspecified joint: Secondary | ICD-10-CM | POA: Insufficient documentation

## 2015-10-23 DIAGNOSIS — N390 Urinary tract infection, site not specified: Secondary | ICD-10-CM | POA: Insufficient documentation

## 2015-10-23 DIAGNOSIS — M549 Dorsalgia, unspecified: Secondary | ICD-10-CM | POA: Diagnosis not present

## 2015-10-23 DIAGNOSIS — E860 Dehydration: Secondary | ICD-10-CM | POA: Diagnosis not present

## 2015-10-23 DIAGNOSIS — Z79899 Other long term (current) drug therapy: Secondary | ICD-10-CM | POA: Diagnosis not present

## 2015-10-23 DIAGNOSIS — F17210 Nicotine dependence, cigarettes, uncomplicated: Secondary | ICD-10-CM | POA: Insufficient documentation

## 2015-10-23 DIAGNOSIS — E1065 Type 1 diabetes mellitus with hyperglycemia: Secondary | ICD-10-CM | POA: Insufficient documentation

## 2015-10-23 DIAGNOSIS — Z3202 Encounter for pregnancy test, result negative: Secondary | ICD-10-CM | POA: Insufficient documentation

## 2015-10-23 DIAGNOSIS — J45909 Unspecified asthma, uncomplicated: Secondary | ICD-10-CM | POA: Diagnosis not present

## 2015-10-23 DIAGNOSIS — E669 Obesity, unspecified: Secondary | ICD-10-CM | POA: Diagnosis not present

## 2015-10-23 DIAGNOSIS — R42 Dizziness and giddiness: Secondary | ICD-10-CM | POA: Diagnosis not present

## 2015-10-23 DIAGNOSIS — Z7984 Long term (current) use of oral hypoglycemic drugs: Secondary | ICD-10-CM | POA: Insufficient documentation

## 2015-10-23 DIAGNOSIS — R739 Hyperglycemia, unspecified: Secondary | ICD-10-CM

## 2015-10-23 LAB — CBC
HCT: 41.1 % (ref 36.0–46.0)
HEMOGLOBIN: 13.7 g/dL (ref 12.0–15.0)
MCH: 26.7 pg (ref 26.0–34.0)
MCHC: 33.3 g/dL (ref 30.0–36.0)
MCV: 80.1 fL (ref 78.0–100.0)
PLATELETS: 322 10*3/uL (ref 150–400)
RBC: 5.13 MIL/uL — ABNORMAL HIGH (ref 3.87–5.11)
RDW: 12.7 % (ref 11.5–15.5)
WBC: 6 10*3/uL (ref 4.0–10.5)

## 2015-10-23 LAB — URINE MICROSCOPIC-ADD ON

## 2015-10-23 LAB — URINALYSIS, ROUTINE W REFLEX MICROSCOPIC
BILIRUBIN URINE: NEGATIVE
BILIRUBIN URINE: NEGATIVE
Glucose, UA: >1000 mg/dL — AB
Glucose, UA: >1000 mg/dL — AB
KETONES UR: NEGATIVE mg/dL
Ketones, ur: NEGATIVE mg/dL
NITRITE: NEGATIVE
Nitrite: NEGATIVE
PH: 6 (ref 5.0–8.0)
PROTEIN: NEGATIVE mg/dL
Protein, ur: NEGATIVE mg/dL
SPECIFIC GRAVITY, URINE: 1.038 — AB (ref 1.005–1.030)
SPECIFIC GRAVITY, URINE: 1.043 — AB (ref 1.005–1.030)
pH: 6 (ref 5.0–8.0)

## 2015-10-23 LAB — PREGNANCY, URINE: Preg Test, Ur: NEGATIVE

## 2015-10-23 LAB — CBG MONITORING, ED
GLUCOSE-CAPILLARY: 450 mg/dL — AB (ref 65–99)
GLUCOSE-CAPILLARY: 192 mg/dL — AB (ref 65–99)

## 2015-10-23 LAB — BASIC METABOLIC PANEL
ANION GAP: 11 (ref 5–15)
BUN: <5 mg/dL — ABNORMAL LOW (ref 6–20)
CALCIUM: 9.8 mg/dL (ref 8.9–10.3)
CO2: 27 mmol/L (ref 22–32)
CREATININE: 0.6 mg/dL (ref 0.44–1.00)
Chloride: 96 mmol/L — ABNORMAL LOW (ref 101–111)
GLUCOSE: 473 mg/dL — AB (ref 65–99)
Potassium: 4 mmol/L (ref 3.5–5.1)
Sodium: 134 mmol/L — ABNORMAL LOW (ref 135–145)

## 2015-10-23 MED ORDER — INSULIN GLARGINE 100 UNIT/ML ~~LOC~~ SOLN: 53.0000 [IU] | Freq: Every day | SUBCUTANEOUS | Status: AC

## 2015-10-23 MED ORDER — CEPHALEXIN 500 MG PO CAPS: 500.0000 mg | ORAL_CAPSULE | Freq: Three times a day (TID) | ORAL | Status: AC

## 2015-10-23 MED ORDER — ONDANSETRON HCL 4 MG/2ML IJ SOLN
4.0000 mg | Freq: Once | INTRAMUSCULAR | Status: AC
  Administered 2015-10-23: 4 mg via INTRAVENOUS
  Filled 2015-10-23: qty 2

## 2015-10-23 MED ORDER — METRONIDAZOLE 500 MG PO TABS: 500.0000 mg | ORAL_TABLET | Freq: Two times a day (BID) | ORAL | Status: AC

## 2015-10-23 MED ORDER — INSULIN ASPART 100 UNIT/ML ~~LOC~~ SOLN
15.0000 [IU] | Freq: Once | SUBCUTANEOUS | Status: AC
  Administered 2015-10-23: 15 [IU] via INTRAVENOUS
  Filled 2015-10-23: qty 1

## 2015-10-23 MED ORDER — SODIUM CHLORIDE 0.9 % IV BOLUS (SEPSIS)
1000.0000 mL | Freq: Once | INTRAVENOUS | Status: AC
  Administered 2015-10-23: 1000 mL via INTRAVENOUS

## 2015-10-23 NOTE — ED Notes (Signed)
Patient complaining of numbness in right leg, in "calf area". Patient is able to move extremities unassisted, assessed ambulation. Ambulated approximately 5150ft in hallway, steady gait, reports numbness in calf. Returned to bed, and explained plan of care.  Patient and visitor acknowledge.

## 2015-10-23 NOTE — ED Provider Notes (Signed)
CSN: [id number]     Arrival date & time 10/23/15  1749 History   First MD Initiated Contact with Patient 10/23/15 2104     Chief Complaint  Patient presents with  . Hyperglycemia     (Consider location/radiation/quality/duration/timing/severity/associated sxs/prior Treatment) Patient is a 20 y.o. female presenting with hyperglycemia. The history is provided by the patient.  Hyperglycemia Blood sugar level PTA:  500 Severity:  Severe Onset quality:  Gradual Duration:  1 week Timing:  Constant Progression:  Worsening Chronicity:  Recurrent Diabetes status:  Controlled with insulin Current diabetic therapy:  Lantus and metformin Time since last antidiabetic medication:  2 months Context: noncompliance   Relieved by:  Nothing Ineffective treatments:  None tried Associated symptoms: dehydration, dizziness, dysuria, fatigue, malaise and polyuria   Associated symptoms: no abdominal pain, no chest pain, no diaphoresis, no fever, no nausea, no shortness of breath, no vomiting and no weakness   Risk factors: obesity     Past Medical History  Diagnosis Date  . Asthma   . Diabetes mellitus type I (HCC)   . Diabetes mellitus type II   . Menorrhagia with regular cycle   . Obesity    Past Surgical History  Procedure Laterality Date  . Dental surgery     Family History  Problem Relation Age of Onset  . Obesity Mother   . Hypertension Maternal Grandmother   . Cancer Maternal Grandmother   . Diabetes Maternal Grandfather   . Kidney disease Maternal Grandfather   . Obesity Maternal Grandfather   . Diabetes Paternal Grandmother   . Obesity Paternal Grandmother   . Obesity Father   . Obesity Sister   . Obesity Brother   . Diabetes Maternal Aunt   . Obesity Maternal Aunt   . Diabetes Paternal Aunt    Social History  Substance Use Topics  . Smoking status: Current Every Day Smoker    Types: Cigarettes  . Smokeless tobacco: Not on file  . Alcohol Use: Yes   OB History    No  data available     Review of Systems  Constitutional: Positive for fatigue. Negative for fever, chills, diaphoresis, activity change and appetite change.  HENT: Negative for facial swelling, rhinorrhea, sore throat, trouble swallowing and voice change.   Eyes: Negative for photophobia, pain and visual disturbance.  Respiratory: Positive for cough. Negative for shortness of breath, wheezing and stridor.   Cardiovascular: Negative for chest pain, palpitations and leg swelling.  Gastrointestinal: Negative for nausea, vomiting, abdominal pain, constipation and anal bleeding.  Endocrine: Positive for polyuria.  Genitourinary: Positive for dysuria. Negative for vaginal bleeding, vaginal discharge and vaginal pain.  Musculoskeletal: Positive for myalgias, back pain and arthralgias. Negative for gait problem, neck pain and neck stiffness.  Skin: Negative.  Negative for rash.  Allergic/Immunologic: Negative.   Neurological: Positive for dizziness and light-headedness. Negative for tremors, syncope, weakness and headaches.  Psychiatric/Behavioral: Negative for suicidal ideas, sleep disturbance and self-injury.  All other systems reviewed and are negative.     Allergies  Bee venom and Strawberry extract  Home Medications   Prior to Admission medications   Medication Sig Start Date End Date Taking? Authorizing Provider  albuterol (PROVENTIL HFA;VENTOLIN HFA) 108 (90 BASE) MCG/ACT inhaler Inhale 2 puffs into the lungs every 6 (six) hours as needed for wheezing or shortness of breath. 08/22/11  Yes Adlih Moreno-Coll, MD  albuterol (PROVENTIL) (2.5 MG/3ML) 0.083% nebulizer solution Take 3 mLs (2.5 mg total) by nebulization every 6 (six) hours as  needed for wheezing or shortness of breath. 12/01/13  Yes Riki Sheer, PA-C  cephALEXin (KEFLEX) 500 MG capsule Take 1 capsule (500 mg total) by mouth 3 (three) times daily. 10/24/15 10/30/15  Lula Olszewski, MD  doxycycline (VIBRAMYCIN) 100 MG capsule Take 1  capsule (100 mg total) by mouth 2 (two) times daily. Patient not taking: Reported on 10/23/2015 10/10/15   Melton Krebs, PA-C  glucagon 1 MG injection Use for Severe Hypoglycemia . Inject 1 mg intramuscularly if unresponsive, unable to swallow, unconscious and/or has seizure Patient not taking: Reported on 10/23/2015 06/25/15 06/24/16  Casey Burkitt, MD  insulin glargine (LANTUS) 100 UNIT/ML injection Inject 0.53 mLs (53 Units total) into the skin daily. 10/23/15   Lula Olszewski, MD  Lancets (ACCU-CHEK MULTICLIX) lancets Use as instructed Patient not taking: Reported on 10/23/2015 06/25/15   Casey Burkitt, MD  metFORMIN (GLUCOPHAGE) 500 MG tablet Take 1 tablet (500 mg total) by mouth 2 (two) times daily with a meal. Patient not taking: Reported on 10/23/2015 06/25/15   Casey Burkitt, MD  metroNIDAZOLE (FLAGYL) 500 MG tablet Take 1 tablet (500 mg total) by mouth 2 (two) times daily. 10/23/15   Lula Olszewski, MD  montelukast (SINGULAIR) 10 MG tablet Take 1 tablet (10 mg total) by mouth at bedtime. Patient not taking: Reported on 10/23/2015 08/22/11   Adlih Moreno-Coll, MD  triamcinolone (KENALOG) 0.025 % ointment Apply 1 application topically 2 (two) times daily. Patient not taking: Reported on 10/23/2015 06/08/15   Linna Hoff, MD   BP 100/71 mmHg  Pulse 86  Temp(Src) 99.2 F (37.3 C) (Oral)  Resp 20  Ht 5\' 4"  (1.626 m)  SpO2 99%  LMP 10/08/2015 (Approximate) Physical Exam  Constitutional: She is oriented to person, place, and time. She appears well-developed and well-nourished. No distress.  Obese young african american female in NAD  HENT:  Head: Normocephalic and atraumatic.  Right Ear: External ear normal.  Left Ear: External ear normal.  Mouth/Throat: Oropharynx is clear and moist. No oropharyngeal exudate.  Eyes: Conjunctivae and EOM are normal. Pupils are equal, round, and reactive to light. No scleral icterus.  Neck: Normal range of motion. Neck  supple. No JVD present. No tracheal deviation present. No thyromegaly present.  Cardiovascular: Normal rate, regular rhythm and intact distal pulses.   Pulmonary/Chest: Effort normal and breath sounds normal. No respiratory distress. She has no wheezes. She has no rales.  Abdominal: Soft. Bowel sounds are normal. She exhibits no distension. There is no tenderness.  Musculoskeletal: Normal range of motion. She exhibits no tenderness.  Neurological: She is alert and oriented to person, place, and time. No cranial nerve deficit. She exhibits normal muscle tone. Coordination normal.  GCS 15. 5/5 strength in all 4 extremities. Normal Gait.   Skin: Skin is warm and dry. She is not diaphoretic. No pallor.  Psychiatric: She has a normal mood and affect. She expresses no homicidal and no suicidal ideation. She expresses no suicidal plans and no homicidal plans.  Nursing note and vitals reviewed.   ED Course  Procedures (including critical care time) Labs Review Labs Reviewed  BASIC METABOLIC PANEL - Abnormal; Notable for the following:    Sodium 134 (*)    Chloride 96 (*)    Glucose, Bld 473 (*)    BUN <5 (*)    All other components within normal limits  CBC - Abnormal; Notable for the following:    RBC 5.13 (*)    All other components  within normal limits  URINALYSIS, ROUTINE W REFLEX MICROSCOPIC (NOT AT [facility]) - Abnormal; Notable for the following:    APPearance HAZY (*)    Specific Gravity, Urine 1.038 (*)    Glucose, UA >1000 (*)    Hgb urine dipstick SMALL (*)    Leukocytes, UA MODERATE (*)    All other components within normal limits  URINE MICROSCOPIC-ADD ON - Abnormal; Notable for the following:    Squamous Epithelial / LPF 6-30 (*)    Bacteria, UA FEW (*)    All other components within normal limits  URINALYSIS, ROUTINE W REFLEX MICROSCOPIC (NOT AT [facility]) - Abnormal; Notable for the following:    APPearance CLOUDY (*)    Specific Gravity, Urine 1.043 (*)    Glucose, UA >1000 (*)     Hgb urine dipstick SMALL (*)    Leukocytes, UA MODERATE (*)    All other components within normal limits  URINE MICROSCOPIC-ADD ON - Abnormal; Notable for the following:    Squamous Epithelial / LPF 0-5 (*)    Bacteria, UA FEW (*)    All other components within normal limits  CBG MONITORING, ED - Abnormal; Notable for the following:    Glucose-Capillary 450 (*)    All other components within normal limits  CBG MONITORING, ED - Abnormal; Notable for the following:    Glucose-Capillary 192 (*)    All other components within normal limits  URINE CULTURE  PREGNANCY, URINE  CBG MONITORING, ED    Imaging Review Dg Chest 2 View  10/23/2015  CLINICAL DATA:  Initial evaluation for several day history of cough, wheezing. History of asthma. EXAM: CHEST  2 VIEW COMPARISON:  Prior study from 06/20/2015. FINDINGS: Cardiac and mediastinal silhouettes are stable in size and contour, and remain within normal limits. Lungs are normally inflated. Minimal patchy opacity at the left lung base. Lungs are otherwise clear. No pulmonary edema or pleural effusion. No pneumothorax. No acute osseous abnormality. IMPRESSION: Mild hazy and patchy opacity at the mid left lung base. While this finding may reflect atelectasis, a small focal infiltrate could be considered in the correct clinical setting. Electronically Signed   By: Rise Mu M.D.   On: 10/23/2015 21:52   I have personally reviewed and evaluated these images and lab results as part of my medical decision-making.   EKG Interpretation None      MDM   Final diagnoses:  Hyperglycemia  UTI (lower urinary tract infection)    The patient is a 20 year old female with type 2 diabetes who is poorly compliant with therapy stating is 2 months and she has taken either her Lantus or metformin who presents with hyperglycemia and multiple vague complaints. Arrival patient was noted to be afebrile. Stable. She is nontoxic appearing and in no acute  distress. Patient tolerates by mouth well. Patient hyperglycemic however not in DKA given normal anion gap and bicarbonate level. Patient complains of dysuria and has bacteria and urine will treat with 7 days of Keflex. Patient given 2 week [place] her primary care for definitive management of these symptoms was stressed to her. Do not feel patient is septic. Patient is tolerating PO well the and is noted to be eating a fast food sand which and sprite during her stay in the emergency department as well as ambulating with no difficulty. The patient is appropriate for patient follow-up with standard ED return precautions. Patient expressed understanding and agreement with  this plan.  Patient seen with attending, Dr. Ranae Palms, who oversaw clinical decision making.     Lula Olszewski, MD 10/24/15 1610  Loren Racer, MD 10/28/15 2256

## 2015-10-23 NOTE — ED Notes (Signed)
Pt reports she was here last night for hyperglycemia but left due to long wait. She returned today because she continues to feel dizZy and have bilateral side and back pain. She has been out of her diabetes medications x 2 mnths.

## 2015-10-23 NOTE — ED Notes (Signed)
Dr. yelverton at the bedside.  

## 2015-10-25 LAB — URINE CULTURE

## 2016-01-10 ENCOUNTER — Emergency Department (HOSPITAL_COMMUNITY)
Admission: EM | Admit: 2016-01-10 | Discharge: 2016-01-11 | Disposition: A | Discharge status: Home / Self Care | Payer: Medicaid Other | Attending: Emergency Medicine | Admitting: Emergency Medicine

## 2016-01-10 ENCOUNTER — Encounter (HOSPITAL_COMMUNITY): Payer: Self-pay | Admitting: Emergency Medicine

## 2016-01-10 DIAGNOSIS — N760 Acute vaginitis: Secondary | ICD-10-CM | POA: Diagnosis not present

## 2016-01-10 DIAGNOSIS — E669 Obesity, unspecified: Secondary | ICD-10-CM | POA: Insufficient documentation

## 2016-01-10 DIAGNOSIS — Z6841 Body Mass Index (BMI) 40.0 and over, adult: Secondary | ICD-10-CM | POA: Diagnosis not present

## 2016-01-10 DIAGNOSIS — J45909 Unspecified asthma, uncomplicated: Secondary | ICD-10-CM | POA: Diagnosis not present

## 2016-01-10 DIAGNOSIS — R350 Frequency of micturition: Secondary | ICD-10-CM | POA: Diagnosis present

## 2016-01-10 DIAGNOSIS — E1065 Type 1 diabetes mellitus with hyperglycemia: Secondary | ICD-10-CM | POA: Diagnosis not present

## 2016-01-10 DIAGNOSIS — B9689 Other specified bacterial agents as the cause of diseases classified elsewhere: Secondary | ICD-10-CM | POA: Insufficient documentation

## 2016-01-10 DIAGNOSIS — J069 Acute upper respiratory infection, unspecified: Secondary | ICD-10-CM | POA: Diagnosis not present

## 2016-01-10 DIAGNOSIS — R739 Hyperglycemia, unspecified: Secondary | ICD-10-CM

## 2016-01-10 DIAGNOSIS — B373 Candidiasis of vulva and vagina: Secondary | ICD-10-CM | POA: Insufficient documentation

## 2016-01-10 DIAGNOSIS — B3731 Acute candidiasis of vulva and vagina: Secondary | ICD-10-CM

## 2016-01-10 DIAGNOSIS — F17210 Nicotine dependence, cigarettes, uncomplicated: Secondary | ICD-10-CM | POA: Diagnosis not present

## 2016-01-10 LAB — URINALYSIS, ROUTINE W REFLEX MICROSCOPIC
Bilirubin Urine: NEGATIVE
HGB URINE DIPSTICK: NEGATIVE
Ketones, ur: NEGATIVE mg/dL
Nitrite: NEGATIVE
PH: 6.5 (ref 5.0–8.0)
PROTEIN: NEGATIVE mg/dL
SPECIFIC GRAVITY, URINE: 1.038 — AB (ref 1.005–1.030)

## 2016-01-10 LAB — URINE MICROSCOPIC-ADD ON: RBC / HPF: NONE SEEN RBC/hpf (ref 0–5)

## 2016-01-10 LAB — CBG MONITORING, ED: Glucose-Capillary: 281 mg/dL — ABNORMAL HIGH (ref 65–99)

## 2016-01-10 LAB — POC URINE PREG, ED: Preg Test, Ur: NEGATIVE

## 2016-01-10 MED ORDER — PREDNISONE 20 MG PO TABS
60.0000 mg | ORAL_TABLET | Freq: Once | ORAL | Status: AC
  Administered 2016-01-10: 60 mg via ORAL
  Filled 2016-01-10: qty 3

## 2016-01-10 MED ORDER — IPRATROPIUM BROMIDE 0.02 % IN SOLN
0.5000 mg | Freq: Once | RESPIRATORY_TRACT | Status: AC
  Administered 2016-01-10: 0.5 mg via RESPIRATORY_TRACT
  Filled 2016-01-10: qty 2.5

## 2016-01-10 MED ORDER — ALBUTEROL SULFATE (2.5 MG/3ML) 0.083% IN NEBU
5.0000 mg | INHALATION_SOLUTION | Freq: Once | RESPIRATORY_TRACT | Status: AC
  Administered 2016-01-10: 5 mg via RESPIRATORY_TRACT
  Filled 2016-01-10: qty 6

## 2016-01-10 NOTE — ED Notes (Signed)
Pt c/o cough worsening x 3 weeks with yellow sputum x 1 week. bilat insp and exp wheezing noted. Pt also c/o vaginal pain worse with urination, +itching and odor. Pt denies discharge.

## 2016-01-10 NOTE — ED Notes (Signed)
Pt medicated per PA order, Neb tx completed.

## 2016-01-10 NOTE — ED Provider Notes (Signed)
CSN: [id number]     Arrival date & time 01/10/16  2010 History  By signing my name below, I, Tanda Rockers, attest that this documentation has been prepared under the direction and in the presence of Marlon Pel, PA-C. Electronically Signed: Tanda Rockers, ED Scribe. 01/10/2016. 10:33 PM.   Chief Complaint  Patient presents with  . Cough  . Urinary Frequency   The history is provided by the patient. No language interpreter was used.    HPI Comments: Brandi Robertson is a 20 y.o. female with PMHx asthma and diabetes, who presents to the Emergency Department complaining of gradual onset, constant, productive cough x 3 weeks. Pt also complains of wheezing, left flank pain that began today, dysuria, and intermittent vaginal pain. Pt has hx of asthma and has been using her inhaler without relief. Denies fever, nausea, vomiting, diarrhea, headache, sore throat, ear pain, or any other associated symptoms. Pt does not check her sugar levels regularly and does not know what her glucose levels are at baseline.   Past Medical History  Diagnosis Date  . Asthma   . Diabetes mellitus type I (HCC)   . Diabetes mellitus type II   . Menorrhagia with regular cycle   . Obesity    Past Surgical History  Procedure Laterality Date  . Dental surgery     Family History  Problem Relation Age of Onset  . Obesity Mother   . Hypertension Maternal Grandmother   . Cancer Maternal Grandmother   . Diabetes Maternal Grandfather   . Kidney disease Maternal Grandfather   . Obesity Maternal Grandfather   . Diabetes Paternal Grandmother   . Obesity Paternal Grandmother   . Obesity Father   . Obesity Sister   . Obesity Brother   . Diabetes Maternal Aunt   . Obesity Maternal Aunt   . Diabetes Paternal Aunt    Social History  Substance Use Topics  . Smoking status: Current Every Day Smoker    Types: Cigarettes  . Smokeless tobacco: None  . Alcohol Use: Yes   OB History    No data available      Review of Systems  A complete 10 system review of systems was obtained and all systems are negative except as noted in the HPI and PMH.   Allergies  Bee venom and Strawberry extract  Home Medications   Prior to Admission medications   Medication Sig Start Date End Date Taking? Authorizing Provider  albuterol (PROVENTIL HFA;VENTOLIN HFA) 108 (90 BASE) MCG/ACT inhaler Inhale 2 puffs into the lungs every 6 (six) hours as needed for wheezing or shortness of breath. 08/22/11  Yes Adlih Moreno-Coll, MD  albuterol (PROVENTIL) (2.5 MG/3ML) 0.083% nebulizer solution Take 3 mLs (2.5 mg total) by nebulization every 6 (six) hours as needed for wheezing or shortness of breath. 12/01/13  Yes Riki Sheer, PA-C  glucagon 1 MG injection Use for Severe Hypoglycemia . Inject 1 mg intramuscularly if unresponsive, unable to swallow, unconscious and/or has seizure 06/25/15 06/24/16 Yes Hillary Percell Boston, MD  insulin glargine (LANTUS) 100 UNIT/ML injection Inject 0.53 mLs (53 Units total) into the skin daily. 10/23/15  Yes Lula Olszewski, MD  Lancets (ACCU-CHEK MULTICLIX) lancets Use as instructed 06/25/15  Yes Hillary Percell Boston, MD  montelukast (SINGULAIR) 10 MG tablet Take 1 tablet (10 mg total) by mouth at bedtime. 08/22/11  Yes Adlih Moreno-Coll, MD  triamcinolone (KENALOG) 0.025 % ointment Apply 1 application topically 2 (two) times daily. 06/08/15  Yes Linna Hoff, MD  doxycycline (VIBRAMYCIN) 100 MG capsule Take 1 capsule (100 mg total) by mouth 2 (two) times daily. Patient not taking: Reported on 10/23/2015 10/10/15   Melton KrebsSamantha Nicole Riley, PA-C  fluconazole (DIFLUCAN) 200 MG tablet Take 1 tablet (200 mg total) by mouth daily. 01/11/16 01/18/16  Irlene Crudup Neva SeatGreene, PA-C  metFORMIN (GLUCOPHAGE) 500 MG tablet Take 1 tablet (500 mg total) by mouth 2 (two) times daily with a meal. Patient not taking: Reported on 10/23/2015 06/25/15   Casey BurkittHillary Moen Fitzgerald, MD  metroNIDAZOLE (FLAGYL) 500 MG tablet Take 1  tablet (500 mg total) by mouth 2 (two) times daily. 10/23/15   Lula OlszewskiMike Goebel, MD   BP 125/73 mmHg  Pulse 106  Temp(Src) 98.2 F (36.8 C) (Oral)  Resp 18  Ht 5\' 4"  (1.626 m)  Wt 121.383 kg  BMI 45.91 kg/m2  SpO2 100%  LMP 01/04/2016   Physical Exam  Constitutional: She is oriented to person, place, and time. She appears well-developed and well-nourished. No distress.  HENT:  Head: Normocephalic and atraumatic.  Eyes: Conjunctivae and EOM are normal.  Neck: Neck supple. No tracheal deviation present.  Cardiovascular: Normal rate.   Pulmonary/Chest: No respiratory distress. She has wheezes.  Mild decreased breath sounds Mild expiratory wheezes  Genitourinary: Uterus normal. There is rash and tenderness on the right labia. There is no lesion or injury on the right labia. There is rash (erythematous and scaling) and tenderness on the left labia. There is no lesion or injury on the left labia. Cervix exhibits no motion tenderness, no discharge and no friability. Right adnexum displays no mass and no tenderness. Left adnexum displays no mass and no tenderness. There is tenderness in the vagina. No erythema or bleeding in the vagina. No foreign body around the vagina. No signs of injury around the vagina. Vaginal discharge (white and yellow) found.  Musculoskeletal: Normal range of motion.  Neurological: She is alert and oriented to person, place, and time.  Skin: Skin is warm and dry.  Psychiatric: She has a normal mood and affect. Her behavior is normal.  Nursing note and vitals reviewed.   ED Course  Procedures (including critical care time)  DIAGNOSTIC STUDIES: Oxygen Saturation is 98% on RA, normal by my interpretation.    COORDINATION OF CARE: 10:28 PM-Discussed treatment plan which includes breathing treatment with pt at bedside and pt agreed to plan.   Labs Review Labs Reviewed  WET PREP, GENITAL - Abnormal; Notable for the following:    Yeast Wet Prep HPF POC PRESENT (*)     Clue Cells Wet Prep HPF POC PRESENT (*)    WBC, Wet Prep HPF POC MANY (*)    All other components within normal limits  URINALYSIS, ROUTINE W REFLEX MICROSCOPIC (NOT AT [facility]) - Abnormal; Notable for the following:    APPearance CLOUDY (*)    Specific Gravity, Urine 1.038 (*)    Glucose, UA >1000 (*)    Leukocytes, UA MODERATE (*)    All other components within normal limits  URINE MICROSCOPIC-ADD ON - Abnormal; Notable for the following:    Squamous Epithelial / LPF 6-30 (*)    Bacteria, UA RARE (*)    All other components within normal limits  CBG MONITORING, ED - Abnormal; Notable for the following:    Glucose-Capillary 281 (*)    All other components within normal limits  URINE CULTURE  POC URINE PREG, ED  GC/CHLAMYDIA PROBE AMP ([place]) NOT AT [facility]    Imaging Review No results found. I  have personally reviewed and evaluated these images and lab results as part of my medical decision-making.   EKG Interpretation None      MDM   Final diagnoses:  Bacterial vaginosis  Hyperglycemia  Vaginal yeast infection  URI (upper respiratory infection)     Patient has yeast and clue cells in her wet prep. She also admits to unprotected sex and has many white blood cells. Will be treated with azithromycin, Rocephin and Flagyl in the emergency department and discharged with Diflucan. Her urinalysis is also abnormal but question whether this is contaminated, will culture urine. Patient admits that she is noncompliant with her diabetes. Her CBG is 281 here in the emergency department a long discussion on how it's difficult to control and treat infection when she has not controlling her sugars now dangerous like to be in the long run.  She's given albuterol and Atrovent nebulizer treatment emergency department which improved her symptoms.  Advised to practice safe sex and have all partners evaluated and treated at the local health department. Also advised to follow with the health  Department in 1-2 weeks to confirm effectiveness of treatment and receive additional education/evaluation. Return precautions given.  I personally performed the services described in this documentation, which was scribed in my presence. The recorded information has been reviewed and is accurate.  Filed Vitals:   01/10/16 2047 01/11/16 0032  BP: 140/85 125/73  Pulse: 84 106  Temp: 98.2 F (36.8 C) 98.2 F (36.8 C)  Resp: 18 [id number] PA-C 01/11/16 0127  Doug Sou, MD 01/12/16 1504

## 2016-01-10 NOTE — ED Notes (Signed)
Neb tx started by RT

## 2016-01-10 NOTE — ED Notes (Addendum)
Pt here with c/o cough x 3 weeks. Pt states this is unlike her asthma. Pt states her cough is productive and she is coughing up yellow sputum. Pt denies fever, chills, n,v,d   Pt also has complaints of vaginal pain with and without urination. Pt denies discharge

## 2016-01-11 LAB — WET PREP, GENITAL
Sperm: NONE SEEN
Trich, Wet Prep: NONE SEEN

## 2016-01-11 MED ORDER — AZITHROMYCIN 250 MG PO TABS
1000.0000 mg | ORAL_TABLET | Freq: Once | ORAL | Status: AC
  Administered 2016-01-11: 1000 mg via ORAL
  Filled 2016-01-11: qty 4

## 2016-01-11 MED ORDER — GUAIFENESIN 100 MG/5ML PO SOLN
5.0000 mL | Freq: Once | ORAL | Status: AC
  Administered 2016-01-11: 100 mg via ORAL
  Filled 2016-01-11: qty 5

## 2016-01-11 MED ORDER — ONDANSETRON 4 MG PO TBDP
4.0000 mg | ORAL_TABLET | Freq: Once | ORAL | Status: AC
  Administered 2016-01-11: 4 mg via ORAL
  Filled 2016-01-11: qty 1

## 2016-01-11 MED ORDER — METRONIDAZOLE 500 MG PO TABS
2000.0000 mg | ORAL_TABLET | Freq: Once | ORAL | Status: AC
  Administered 2016-01-11: 2000 mg via ORAL
  Filled 2016-01-11: qty 4

## 2016-01-11 MED ORDER — CEFTRIAXONE SODIUM 250 MG IJ SOLR
250.0000 mg | Freq: Once | INTRAMUSCULAR | Status: AC
  Administered 2016-01-11: 250 mg via INTRAMUSCULAR
  Filled 2016-01-11: qty 250

## 2016-01-11 MED ORDER — LIDOCAINE HCL 1 % IJ SOLN
INTRAMUSCULAR | Status: AC
  Administered 2016-01-11: 20 mL
  Filled 2016-01-11: qty 20

## 2016-01-11 MED ORDER — BENZONATATE 100 MG PO CAPS: 100.0000 mg | ORAL_CAPSULE | Freq: Three times a day (TID) | ORAL | Status: AC

## 2016-01-11 MED ORDER — FLUCONAZOLE 200 MG PO TABS: 200.0000 mg | ORAL_TABLET | Freq: Every day | ORAL | Status: AC

## 2016-01-11 NOTE — Discharge Instructions (Signed)
Bacterial Vaginosis Bacterial vaginosis is a vaginal infection that occurs when the normal balance of bacteria in the vagina is disrupted. It results from an overgrowth of certain bacteria. This is the most common vaginal infection in women of childbearing age. Treatment is important to prevent complications, especially in pregnant women, as it can cause a premature delivery. CAUSES  Bacterial vaginosis is caused by an increase in harmful bacteria that are normally present in smaller amounts in the vagina. Several different kinds of bacteria can cause bacterial vaginosis. However, the reason that the condition develops is not fully understood. RISK FACTORS Certain activities or behaviors can put you at an increased risk of developing bacterial vaginosis, including:  Having a new sex partner or multiple sex partners.  Douching.  Using an intrauterine device (IUD) for contraception. Women do not get bacterial vaginosis from toilet seats, bedding, swimming pools, or contact with objects around them. SIGNS AND SYMPTOMS  Some women with bacterial vaginosis have no signs or symptoms. Common symptoms include:  Grey vaginal discharge.  A fishlike odor with discharge, especially after sexual intercourse.  Itching or burning of the vagina and vulva.  Burning or pain with urination. DIAGNOSIS  Your health care provider will take a medical history and examine the vagina for signs of bacterial vaginosis. A sample of vaginal fluid may be taken. Your health care provider will look at this sample under a microscope to check for bacteria and abnormal cells. A vaginal pH test may also be done.  TREATMENT  Bacterial vaginosis may be treated with antibiotic medicines. These may be given in the form of a pill or a vaginal cream. A second round of antibiotics may be prescribed if the condition comes back after treatment. Because bacterial vaginosis increases your risk for sexually transmitted diseases, getting  treated can help reduce your risk for chlamydia, gonorrhea, HIV, and herpes. HOME CARE INSTRUCTIONS   Only take over-the-counter or prescription medicines as directed by your health care provider.  If antibiotic medicine was prescribed, take it as directed. Make sure you finish it even if you start to feel better.  Tell all sexual partners that you have a vaginal infection. They should see their health care provider and be treated if they have problems, such as a mild rash or itching.  During treatment, it is important that you follow these instructions:  Avoid sexual activity or use condoms correctly.  Do not douche.  Avoid alcohol as directed by your health care provider.  Avoid breastfeeding as directed by your health care provider. SEEK MEDICAL CARE IF:   Your symptoms are not improving after 3 days of treatment.  You have increased discharge or pain.  You have a fever. MAKE SURE YOU:   Understand these instructions.  Will watch your condition.  Will get help right away if you are not doing well or get worse. FOR MORE INFORMATION  Centers for Disease Control and Prevention, Division of STD Prevention: SolutionApps.co.zawww.cdc.gov/std American Sexual Health Association (ASHA): www.ashastd.org    This information is not intended to replace advice given to you by your health care provider. Make sure you discuss any questions you have with your health care provider.   Document Released: 07/25/2005 Document Revised: 08/15/2014 Document Reviewed: 03/06/2013 Elsevier Interactive Patient Education 2016 [employer] Inc[employer]  Hyperglycemia Hyperglycemia occurs when the glucose (sugar) in your blood is too high. Hyperglycemia can happen for many reasons, but it most often happens to people who do not know they have diabetes or are not  managing their diabetes properly.  CAUSES  Whether you have diabetes or not, there are other causes of hyperglycemia. Hyperglycemia can occur when you have diabetes, but  it can also occur in other situations that you might not be as aware of, such as: Diabetes  If you have diabetes and are having problems controlling your blood glucose, hyperglycemia could occur because of some of the following reasons:  Not following your meal plan.  Not taking your diabetes medications or not taking it properly.  Exercising less or doing less activity than you normally do.  Being sick. Pre-diabetes  This cannot be ignored. Before people develop Type 2 diabetes, they almost always have "pre-diabetes." This is when your blood glucose levels are higher than normal, but not yet high enough to be diagnosed as diabetes. Research has shown that some long-term damage to the body, especially the heart and circulatory system, may already be occurring during pre-diabetes. If you take action to manage your blood glucose when you have pre-diabetes, you may delay or prevent Type 2 diabetes from developing. Stress  If you have diabetes, you may be "diet" controlled or on oral medications or insulin to control your diabetes. However, you may find that your blood glucose is higher than usual in the hospital whether you have diabetes or not. This is often referred to as "stress hyperglycemia." Stress can elevate your blood glucose. This happens because of hormones put out by the body during times of stress. If stress has been the cause of your high blood glucose, it can be followed regularly by your caregiver. That way he/she can make sure your hyperglycemia does not continue to get worse or progress to diabetes. Steroids  Steroids are medications that act on the infection fighting system (immune system) to block inflammation or infection. One side effect can be a rise in blood glucose. Most people can produce enough extra insulin to allow for this rise, but for those who cannot, steroids make blood glucose levels go even higher. It is not unusual for steroid treatments to "uncover" diabetes  that is developing. It is not always possible to determine if the hyperglycemia will go away after the steroids are stopped. A special blood test called an A1c is sometimes done to determine if your blood glucose was elevated before the steroids were started. SYMPTOMS  Thirsty.  Frequent urination.  Dry mouth.  Blurred vision.  Tired or fatigue.  Weakness.  Sleepy.  Tingling in feet or leg. DIAGNOSIS  Diagnosis is made by monitoring blood glucose in one or all of the following ways:  A1c test. This is a chemical found in your blood.  Fingerstick blood glucose monitoring.  Laboratory results. TREATMENT  First, knowing the cause of the hyperglycemia is important before the hyperglycemia can be treated. Treatment may include, but is not be limited to:  Education.  Change or adjustment in medications.  Change or adjustment in meal plan.  Treatment for an illness, infection, etc.  More frequent blood glucose monitoring.  Change in exercise plan.  Decreasing or stopping steroids.  Lifestyle changes. HOME CARE INSTRUCTIONS   Test your blood glucose as directed.  Exercise regularly. Your caregiver will give you instructions about exercise. Pre-diabetes or diabetes which comes on with stress is helped by exercising.  Eat wholesome, balanced meals. Eat often and at regular, fixed times. Your caregiver or nutritionist will give you a meal plan to guide your sugar intake.  Being at an ideal weight is important. If needed, losing  as little as 10 to 15 pounds may help improve blood glucose levels. SEEK MEDICAL CARE IF:   You have questions about medicine, activity, or diet.  You continue to have symptoms (problems such as increased thirst, urination, or weight gain). SEEK IMMEDIATE MEDICAL CARE IF:   You are vomiting or have diarrhea.  Your breath smells fruity.  You are breathing faster or slower.  You are very sleepy or incoherent.  You have numbness, tingling,  or pain in your feet or hands.  You have chest pain.  Your symptoms get worse even though you have been following your caregiver's orders.  If you have any other questions or concerns.   This information is not intended to replace advice given to you by your health care provider. Make sure you discuss any questions you have with your health care provider.   Document Released: 01/18/2001 Document Revised: 10/17/2011 Document Reviewed: 03/31/2015 Elsevier Interactive Patient Education 2016 Elsevier Inc. Monilial Vaginitis Vaginitis in a soreness, swelling and redness (inflammation) of the vagina and vulva. Monilial vaginitis is not a sexually transmitted infection. CAUSES  Yeast vaginitis is caused by yeast (candida) that is normally found in your vagina. With a yeast infection, the candida has overgrown in number to a point that upsets the chemical balance. SYMPTOMS   White, thick vaginal discharge.  Swelling, itching, redness and irritation of the vagina and possibly the lips of the vagina (vulva).  Burning or painful urination.  Painful intercourse. DIAGNOSIS  Things that may contribute to monilial vaginitis are:  Postmenopausal and virginal states.  Pregnancy.  Infections.  Being tired, sick or stressed, especially if you had monilial vaginitis in the past.  Diabetes. Good control will help lower the chance.  Birth control pills.  Tight fitting garments.  Using bubble bath, feminine sprays, douches or deodorant tampons.  Taking certain medications that kill germs (antibiotics).  Sporadic recurrence can occur if you become ill. TREATMENT  Your caregiver will give you medication.  There are several kinds of anti monilial vaginal creams and suppositories specific for monilial vaginitis. For recurrent yeast infections, use a suppository or cream in the vagina 2 times a week, or as directed.  Anti-monilial or steroid cream for the itching or irritation of the vulva  may also be used. Get your caregiver's permission.  Painting the vagina with methylene blue solution may help if the monilial cream does not work.  Eating yogurt may help prevent monilial vaginitis. HOME CARE INSTRUCTIONS   Finish all medication as prescribed.  Do not have sex until treatment is completed or after your caregiver tells you it is okay.  Take warm sitz baths.  Do not douche.  Do not use tampons, especially scented ones.  Wear cotton underwear.  Avoid tight pants and panty hose.  Tell your sexual partner that you have a yeast infection. They should go to their caregiver if they have symptoms such as mild rash or itching.  Your sexual partner should be treated as well if your infection is difficult to eliminate.  Practice safer sex. Use condoms.  Some vaginal medications cause latex condoms to fail. Vaginal medications that harm condoms are:  Cleocin cream.  Butoconazole (Femstat).  Terconazole (Terazol) vaginal suppository.  Miconazole (Monistat) (may be purchased over the counter). SEEK MEDICAL CARE IF:   You have a temperature by mouth above 102 F (38.9 C).  The infection is getting worse after 2 days of treatment.  The infection is not getting better after 3 days of  treatment.  You develop blisters in or around your vagina.  You develop vaginal bleeding, and it is not your menstrual period.  You have pain when you urinate.  You develop intestinal problems.  You have pain with sexual intercourse.   This information is not intended to replace advice given to you by your health care provider. Make sure you discuss any questions you have with your health care provider.   Document Released: 05/04/2005 Document Revised: 10/17/2011 Document Reviewed: 01/26/2015 Elsevier Interactive Patient Education 2016 [employer]. Safe Sex Safe sex is about reducing the risk of giving or getting a sexually transmitted disease (STD). STDs are spread through  sexual contact involving the genitals, mouth, or rectum. Some STDs can be cured and others cannot. Safe sex can also prevent unintended pregnancies.  WHAT ARE SOME SAFE SEX PRACTICES?  Limit your sexual activity to only one partner who is having sex with only you.  Talk to your partner about his or her past partners, past STDs, and drug use.  Use a condom every time you have sexual intercourse. This includes vaginal, oral, and anal sexual activity. Both females and males should wear condoms during oral sex. Only use latex or polyurethane condoms and water-based lubricants. Using petroleum-based lubricants or oils to lubricate a condom will weaken the condom and increase the chance that it will break. The condom should be in place from the beginning to the end of sexual activity. Wearing a condom reduces, but does not completely eliminate, your risk of getting or giving an STD. STDs can be spread by contact with infected body fluids and skin.  Get vaccinated for hepatitis B and HPV.  Avoid alcohol and recreational drugs, which can affect your judgment. You may forget to use a condom or participate in high-risk sex.  For females, avoid douching after sexual intercourse. Douching can spread an infection farther into the reproductive tract.  Check your body for signs of sores, blisters, rashes, or unusual discharge. See your health care provider if you notice any of these signs.  Avoid sexual contact if you have symptoms of an infection or are being treated for an STD. If you or your partner has herpes, avoid sexual contact when blisters are present. Use condoms at all other times.  If you are at risk of being infected with HIV, it is recommended that you take a prescription medicine daily to prevent HIV infection. This is called pre-exposure prophylaxis (PrEP). You are considered at risk if:  You are a man who has sex with other men (MSM).  You are a heterosexual man or woman who is sexually  active with more than one partner.  You take drugs by injection.  You are sexually active with a partner who has HIV.  Talk with your health care provider about whether you are at high risk of being infected with HIV. If you choose to begin PrEP, you should first be tested for HIV. You should then be tested every 3 months for as long as you are taking PrEP.  See your health care provider for regular screenings, exams, and tests for other STDs. Before having sex with a new partner, each of you should be screened for STDs and should talk about the results with each other. WHAT ARE THE BENEFITS OF SAFE SEX?   There is less chance of getting or giving an STD.  You can prevent unwanted or unintended pregnancies.  By discussing safe sex concerns with your partner, you may increase feelings  of intimacy, comfort, trust, and honesty between the two of you.   This information is not intended to replace advice given to you by your health care provider. Make sure you discuss any questions you have with your health care provider.   Document Released: 09/01/2004 Document Revised: 08/15/2014 Document Reviewed: 01/16/2012 Elsevier Interactive Patient Education [employer]

## 2016-01-11 NOTE — ED Notes (Signed)
Pt walk to and from bathroom. x2

## 2016-01-12 LAB — GC/CHLAMYDIA PROBE AMP (~~LOC~~) NOT AT ARMC
Chlamydia: NEGATIVE
Neisseria Gonorrhea: NEGATIVE

## 2016-01-12 LAB — URINE CULTURE: SPECIAL REQUESTS: NORMAL

## 2016-09-22 IMAGING — CT CT ANGIO CHEST
2 of 13 series · 19 of 46 positions shown · IV contrast (OMNI)
Comparison: Chest radiograph 06/20/2015

CLINICAL DATA: Shortness of breath for 1 day.

EXAM:
CT ANGIOGRAPHY CHEST WITH CONTRAST
TECHNIQUE: Multidetector CT imaging of the chest was performed using the
standard protocol during bolus administration of intravenous
contrast. Multiplanar CT image reconstructions and MIPs were
obtained to evaluate the vascular anatomy.
CONTRAST:  100 mL Omnipaque 350 IV

[Series 9: thins · axial · 0.66mm/px · z∈[-300,-67]mm · 16 of 265 slices shown]
[im 16/265  lung]
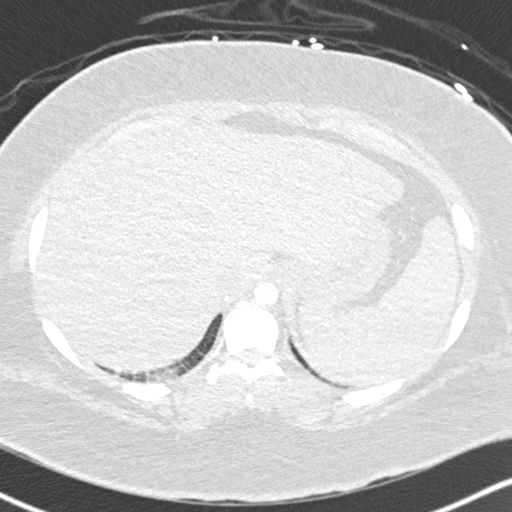
[im 32/265  soft-tissue]
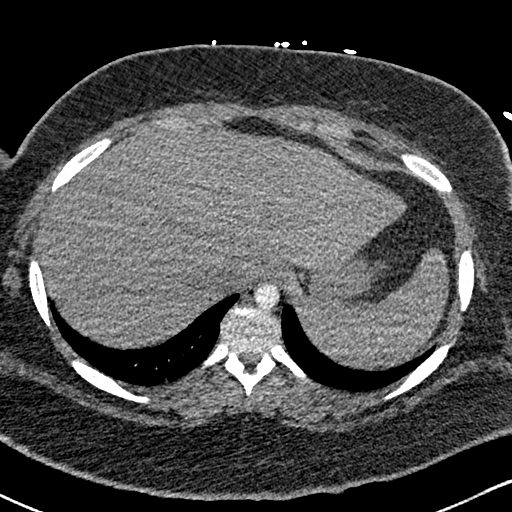
[im 47/265  lung]
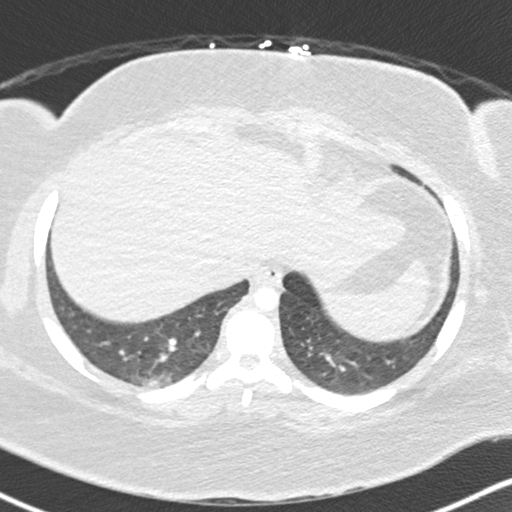
[im 63/265  soft-tissue]
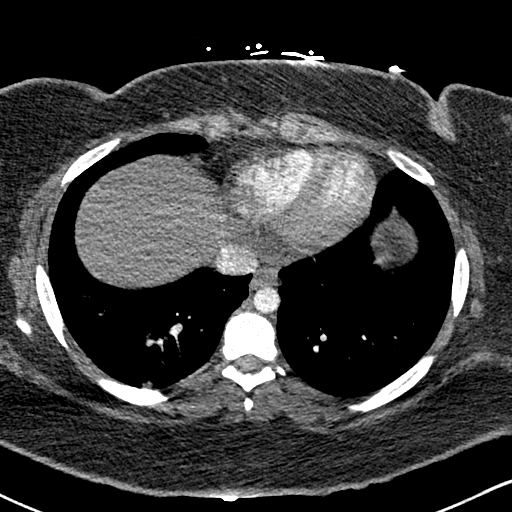
[im 78/265  lung]
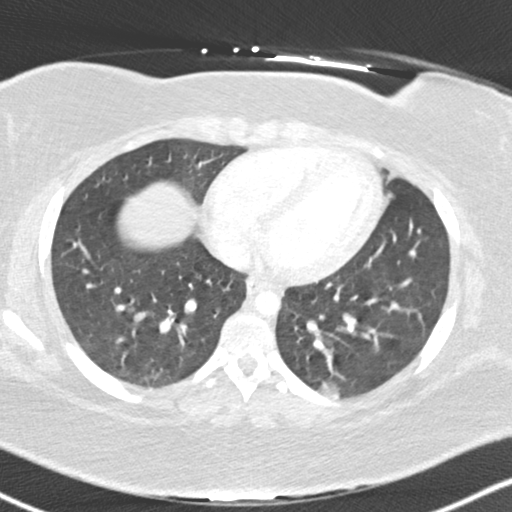
[im 94/265  soft-tissue]
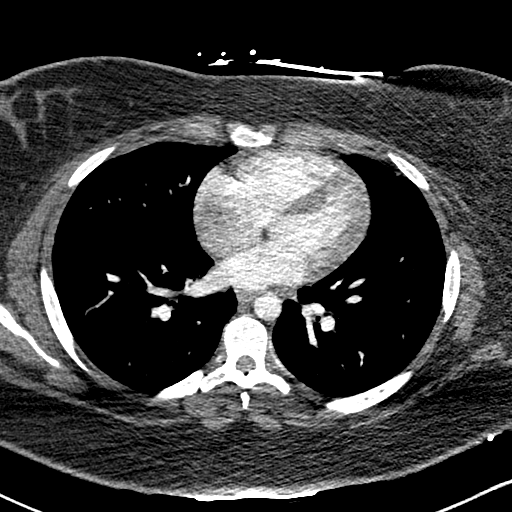
[im 109/265  lung]
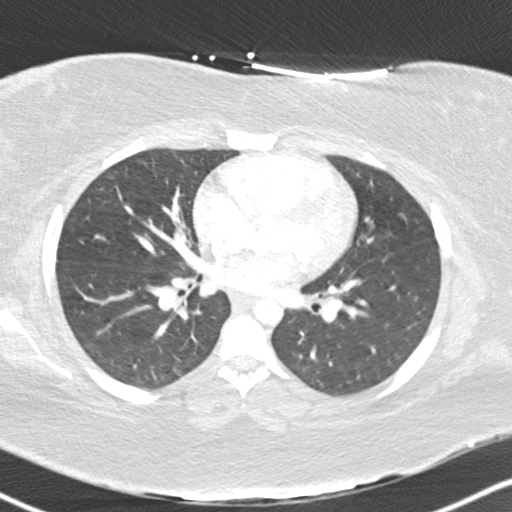
[im 125/265  soft-tissue]
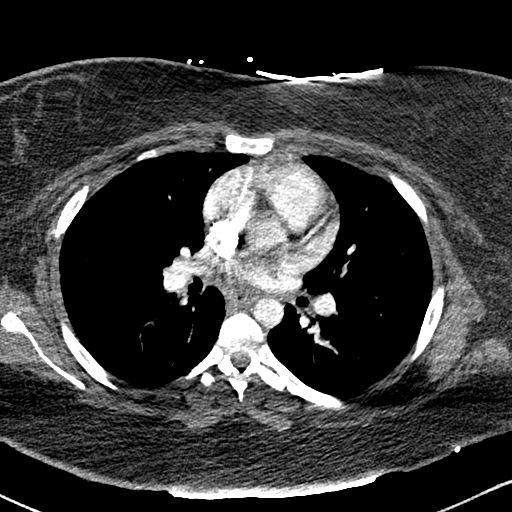
[im 140/265  lung]
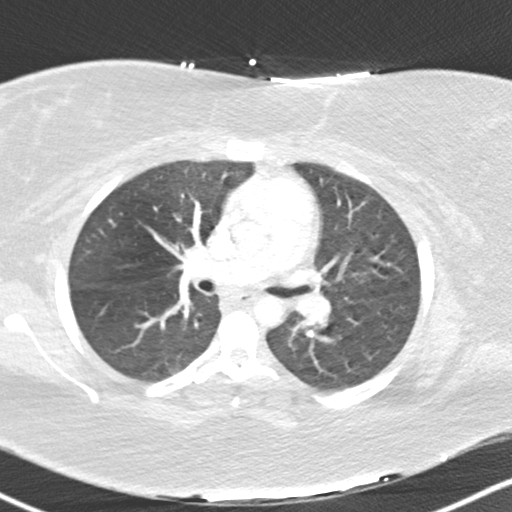
[im 156/265  soft-tissue]
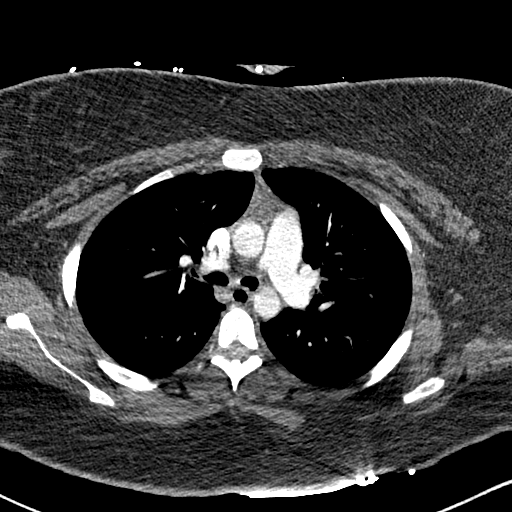
[im 171/265  lung]
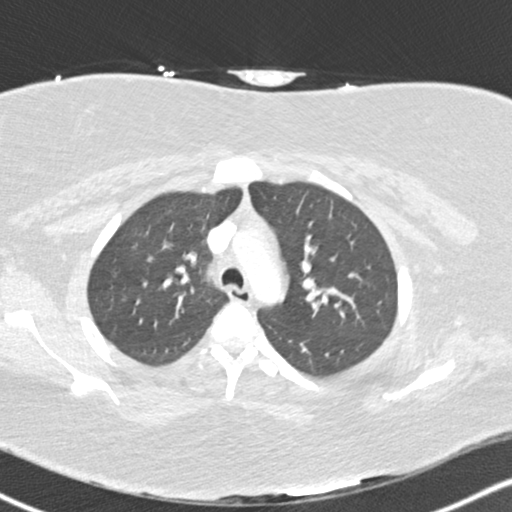
[im 187/265  soft-tissue]
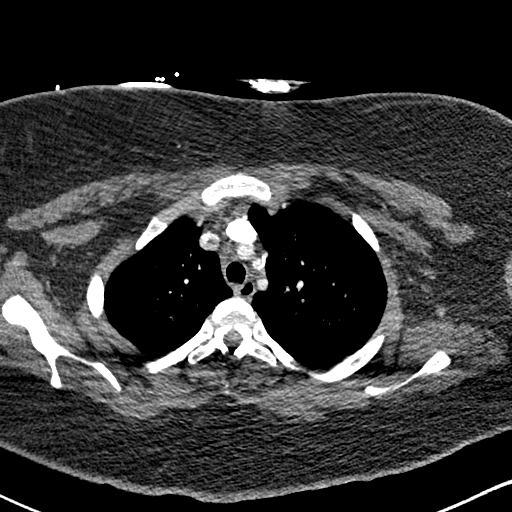
[im 202/265  lung]
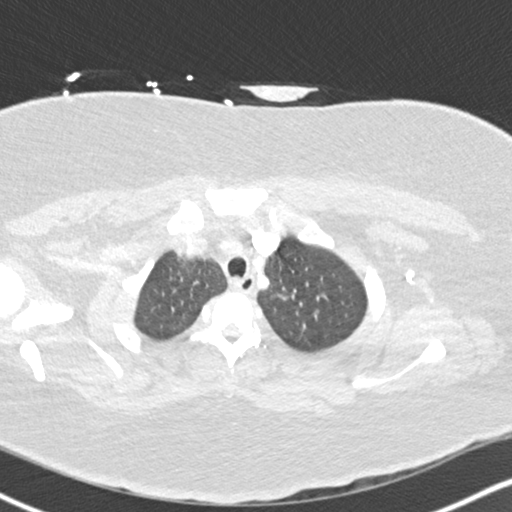
[im 218/265  soft-tissue]
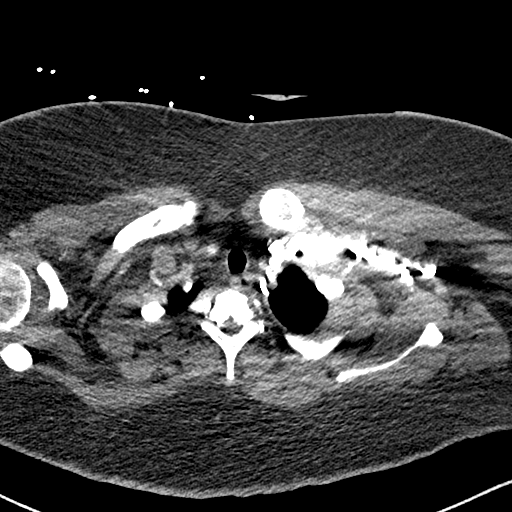
[im 233/265  lung]
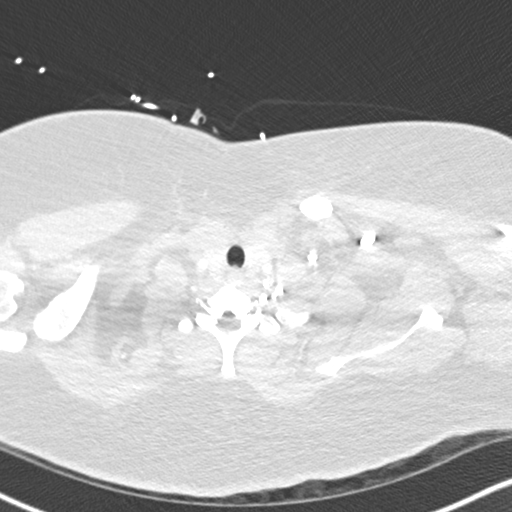
[im 249/265  soft-tissue]
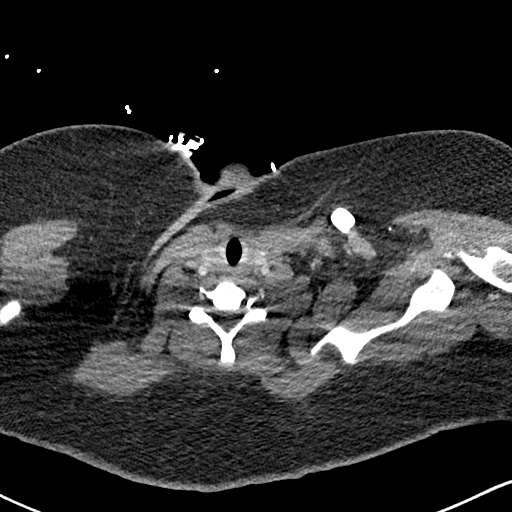

[Series 11: coronal mpr · coronal · 0.59mm/px · 3 of 111 slices shown]
[im 28/111  soft-tissue]
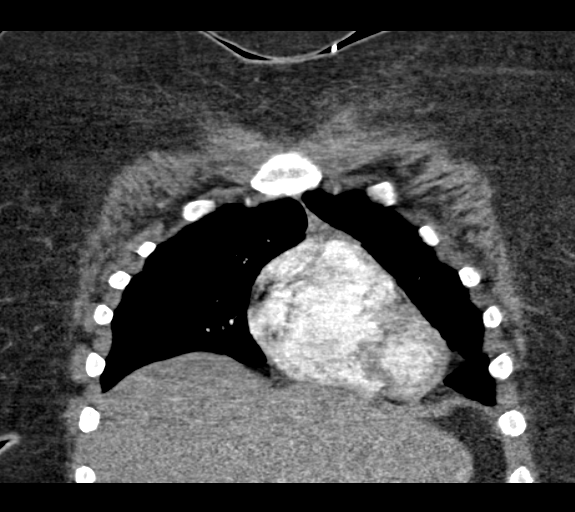
[im 56/111  soft-tissue]
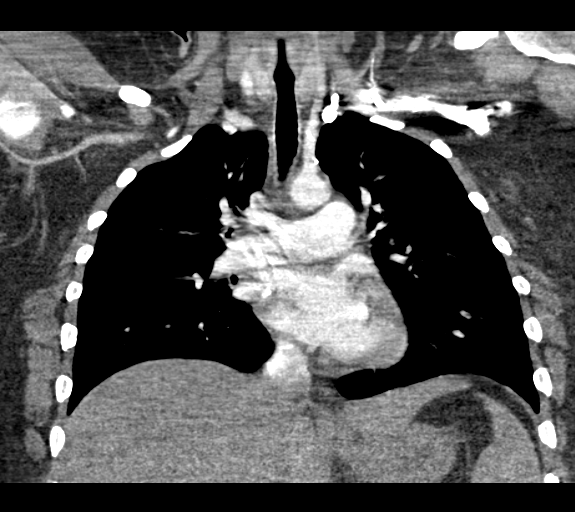
[im 83/111  soft-tissue]
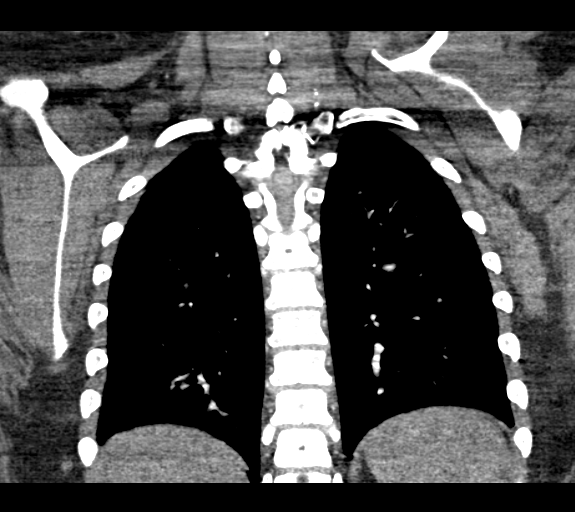

[19 of 46 positions shown; findings below may reference images not displayed]

FINDINGS: There are no filling defects in the central pulmonary arteries, the
subsegmental and some of the segmental branches cannot be assessed
due to contrast bolus timing and soft tissue attenuation from body
habitus.

The heart is normal in size. Thoracic aorta is normal in caliber. No
evidence of dissection or aneurysm. No mediastinal or hilar
adenopathy. No pleural or pericardial effusion.

Mild breathing motion artifact. Minimal depending ground-glass
opacity is likely hypoventilatory atelectasis. No consolidation to
suggest pneumonia. No pulmonary mass or suspicious nodule.

No acute abnormality in the included upper abdomen.

There are no acute or suspicious osseous abnormalities.

Review of the MIP images confirms the above findings.
IMPRESSION: 1. No central pulmonary embolus. Suboptimal contrast bolus timing
and body habitus mid assessment of the subsegmental and some of the
segmental branches.
2. Minimal dependent atelectasis at the lung bases.

## 2017-07-11 DIAGNOSIS — E1165 Type 2 diabetes mellitus with hyperglycemia: Secondary | ICD-10-CM

## 2017-07-11 NOTE — ED Triage Notes (Signed)
Pt C/O abscess on right buttocks

## 2017-07-11 NOTE — ED Notes (Signed)
Pt stated pain was a 9/10 prior to lidocaine administration. After lidocaine administered, pt reports no pain.

## 2017-07-11 NOTE — ED Notes (Addendum)
Patient has been instructed that they have been given Oxycodone* which contains opioids, benzodiazepines, or other sedating drugs. Patient is aware that they  will need to refrain from driving or operating heavy machinery after taking this medication.  Patient also instructed that they need to avoid drinking alcohol and using other products containing opioids, benzodiazepines, or other sedating drugs.  Patient verbalized understanding.  Pt states she and her friend took an Pharmacist, [employer] to the ER. Pt states she will not be driving.

## 2017-07-11 NOTE — ED Notes (Addendum)
Pt presents ambulatory to ED complaining of abscess on intergluteal cleft. Pt states pain started 2 days ago. Pt reports hx of abscess. Pt reports diarrhea beginning today.  Pt is alert and oriented x 4, RR even and unlabored, skin is warm and dry. Assesment completed and pt updated on plan of care.     Emergency Department Nursing Plan of Care       The Nursing Plan of Care is developed from the Nursing assessment and Emergency Department Attending provider initial evaluation.  The plan of care may be reviewed in the ???ED Provider note???.    The Plan of Care was developed with the following considerations:   Patient / Family readiness to learn indicated UE:[id number]:verbalized understanding  Persons(s) to be included in education: patient  Barriers to Learning/Limitations:No    Signed     Weyman RodneyHolly H West    07/11/2017   8:22 PM

## 2017-07-11 NOTE — ED Provider Notes (Signed)
EMERGENCY DEPARTMENT HISTORY AND PHYSICAL EXAM      Date: 07/11/2017  Patient Name: Melinda Grant    History of Presenting Illness     Chief Complaint   Patient presents with   ??? Skin Problem       History Provided By: Patient    HPI: Melinda Grant, 21 y.o. female with PMHx significant for abscesses, asthma and diabetes, presents ambulatory to the ED with cc of mild, constant, gradually worsening, rectal pain beginning today along with associated diarrhea. Pt suggests it may be an abscess considering her hx of having them in the same area. She denies any other alleviating or exacerbating factors. Pt specifically denies fever, chills, CP, SOB, abd pain, nausea, vomiting, or diarrhea.    Chief Complaint: Rectal pain  Duration: 1 Days  Timing:  Gradual, Constant and Worsening  Location: Rectum  Quality: None  Severity: Mild  Modifying Factors: None  Associated Symptoms: diarrhea  There are no other complaints, changes, or physical findings at this time.    PCP: Consuella Lose, MD    Current Outpatient Medications   Medication Sig Dispense Refill   ??? clotrimazole (LOTRIMIN) 1 % topical cream Apply  to affected area two (2) times a day. Apply twice a day for 2-4 weeks 1 Tube 2   ??? clotrimazole (MYCELEX) 1 % vaginal cream Insert 1 Applicator into vagina nightly. 20 g 0   ??? albuterol (PROVENTIL, VENTOLIN) 90 mcg/Actuation inhaler Take 2 Puffs by inhalation every six (6) hours as needed.     ??? loratadine (CLARITIN) 10 mg tablet Take 10 mg by mouth daily.     ??? albuterol sulfate (PROVENTIL;VENTOLIN) 2.5 mg/0.5 mL Nebu 2.5 mg by Nebulization route as needed.     ??? beclomethasone (QVAR) 40 mcg/Actuation inhaler Take 1 Puff by inhalation two (2) times a day.     ??? montelukast (SINGULAIR) 10 mg tablet Take 10 mg by mouth daily.     ??? insulin glargine (LANTUS) 100 unit/mL injection 50 Units by SubCUTAneous route once. Daily in AM      ??? insulin (NOVOLIN 70/30) 100 unit/mL (70-30) injection by SubCUTAneous  route once. Sliding scale, with meals and at bedtime          Past History     Past Medical History:  Past Medical History:   Diagnosis Date   ??? Asthma    ??? Diabetes (HCC)        Past Surgical History:  Past Surgical History:   Procedure Laterality Date   ??? HX HEENT      dental       Family History:  History reviewed. No pertinent family history.    Social History:  Social History     Tobacco Use   ??? Smoking status: Current Every Day Smoker   Substance Use Topics   ??? Alcohol use: No   ??? Drug use: No       Allergies:  Allergies   Allergen Reactions   ??? Bees [Sting, Bee] Swelling   ??? Strawberry Itching         Review of Systems   Review of Systems   Constitutional: Negative for chills and fever.   HENT: Negative for congestion and sore throat.    Eyes: Negative for visual disturbance.   Respiratory: Negative for cough and shortness of breath.    Cardiovascular: Negative for chest pain and leg swelling.   Gastrointestinal: Positive for diarrhea and rectal pain. Negative for abdominal pain, blood in stool  and nausea.   Endocrine: Negative for polyuria.   Genitourinary: Negative for dysuria, flank pain, vaginal bleeding and vaginal discharge.   Musculoskeletal: Negative for myalgias.   Skin: Negative for rash.   Allergic/Immunologic: Negative for immunocompromised state.   Neurological: Negative for weakness and headaches.   Psychiatric/Behavioral: Negative for confusion.       Physical Exam   Physical Exam   Constitutional: She is oriented to person, place, and time. She appears well-developed and well-nourished.   HENT:   Head: Normocephalic and atraumatic.   Moist mucous membranes   Eyes: Conjunctivae are normal. Pupils are equal, round, and reactive to light. Right eye exhibits no discharge. Left eye exhibits no discharge.   Neck: Normal range of motion. Neck supple. No tracheal deviation present.   Cardiovascular: Normal rate, regular rhythm and normal heart sounds.   No murmur heard.   Pulmonary/Chest: Effort normal and breath sounds normal. No respiratory distress. She has no wheezes. She has no rales.   Abdominal: Soft. Bowel sounds are normal. There is no tenderness. There is no rebound and no guarding.   Genitourinary:   Genitourinary Comments: Excoriated skin from top of gluteal cleft and around rectum. Excoriated skin surrounding the labia and vagina with surrounding erythema and satellite lesions consistent with yeast. No abscess palpable at this time.    Musculoskeletal: Normal range of motion. She exhibits no edema, tenderness or deformity.   Neurological: She is alert and oriented to person, place, and time.   Skin: Skin is warm and dry. No rash noted. No erythema.   Psychiatric: Her behavior is normal.   Nursing note and vitals reviewed.      Diagnostic Study Results     Labs -     Recent Results (from the past 12 hour(s))   GLUCOSE, POC    Collection Time: 07/11/17  9:14 PM   Result Value Ref Range    Glucose (POC) 398 (H) 65 - 100 mg/dL    [facility]      Medical Decision Making   I am the first provider for this patient.    I reviewed the vital signs, available nursing notes, past medical history, past surgical history, family history and social history.    Vital Signs-Reviewed the patient's vital signs.  Patient Vitals for the past 12 hrs:   Temp Resp BP SpO2   07/11/17 2002 97.4 ??F (36.3 ??C) 16 126/73 98 %     Records Reviewed: Nursing Notes and Old Medical Records    Provider Notes (Medical Decision Making):   DDx: consistent with cutaneous yeast infection secondary to hyperglycemia. Will treat yeast, she needs to follow up with PCP for hyperglycemia treatment.     ED Course:   Initial assessment performed. The patients presenting problems have been discussed, and they are in agreement with the care plan formulated and outlined with them.  I have encouraged them to ask questions as they arise throughout their visit.        PROGRESS NOTE:   8:15 PM   Pt has excoriated skin on the gluteal cleft. Unable to visualize rectum due to pt clenching from pain. Immediately stopped examination after pt insisted provider due to pain. Will repeat exam after analgesia.     Procedure Note - Rectal Exam:    9:07 PM  Performed by: Marylu LundAaron Jin Shockley, DO  Chaperoned by: Lavina HammanHolly West, RN  Further findings noted in physical exam.   The procedure took 1-15 minutes, and pt tolerated  well.      PROGRESS NOTE:   9:16 PM  Sugar 398. Pt states she is working on getting f/u for diabetes.    Disposition:  DISCHARGE NOTE  9:30 PM  The patient has been re-evaluated and is ready for discharge. Reviewed available results with patient and family. Counseled pt and family on diagnosis and care plan. Pt and family have expressed understanding, and all questions have been answered. Pt and family agree with plan and agree to F/U as recommended, or return to the ED if their sxs worsen. Discharge instructions have been provided and explained to the pt and their family, along with reasons to return to the ED.  Written by Gabrielle DareSusan T Quach, ED Scribe, as dictated by Marylu LundAaron Everline Mahaffy, DO.    PLAN:  1.   Discharge Medication List as of 07/11/2017  9:23 PM      START taking these medications    Details   clotrimazole (LOTRIMIN) 1 % topical cream Apply  to affected area two (2) times a day. Apply twice a day for 2-4 weeks, Normal, Disp-1 Tube, R-2      clotrimazole (MYCELEX) 1 % vaginal cream Insert 1 Applicator into vagina nightly., Disp-20 g, R-0, Normal         CONTINUE these medications which have NOT CHANGED    Details   albuterol (PROVENTIL, VENTOLIN) 90 mcg/Actuation inhaler Take 2 Puffs by inhalation every six (6) hours as needed.Historical Med, 2 Puff      loratadine (CLARITIN) 10 mg tablet Take 10 mg by mouth daily.Historical Med, 10 mg      albuterol sulfate (PROVENTIL;VENTOLIN) 2.5 mg/0.5 mL Nebu 2.5 mg by Nebulization route as needed.Historical Med, 2.5 mg       beclomethasone (QVAR) 40 mcg/Actuation inhaler Take 1 Puff by inhalation two (2) times a day.Historical Med, 1 Puff      montelukast (SINGULAIR) 10 mg tablet Take 10 mg by mouth daily.Historical Med, 10 mg      insulin glargine (LANTUS) 100 unit/mL injection 50 Units by SubCUTAneous route once. Daily in AM Historical Med      insulin (NOVOLIN 70/30) 100 unit/mL (70-30) injection by SubCUTAneous route once. Sliding scale, with meals and at bedtime Historical Med           2.   Follow-up Information     Follow up With Specialties Details Why Contact Info    Consuella LoseKing, Carla, MD Pediatrics Schedule an appointment as soon as possible for a visit  [address]  [address]  [place] [id number]  [phone]      [facility] Emergency Medicine  If symptoms worsen [address]  [place] [id number]  [phone]        Return to ED if worse     Diagnosis     Clinical Impression:   1. Hyperglycemia    2. Yeast infection        Attestations:  This note is prepared by Gabrielle DareSusan T. Quach, acting as Scribe for Marylu LundAaron Ira Busbin, DO.    Marylu LundAaron Walton Digilio, DO: The scribe's documentation has been prepared under my direction and personally reviewed by me in its entirety. I confirm that the note above accurately reflects all work, treatment, procedures, and medical decision making performed by me.      This note will not be viewable in MyChart.

## 2017-07-12 ENCOUNTER — Inpatient Hospital Stay
Admit: 2017-07-12 | Discharge: 2017-07-12 | Disposition: A | Discharge status: Home / Self Care | Payer: Self-pay | Attending: Emergency Medicine

## 2017-07-12 LAB — GLUCOSE, POC: Glucose (POC): 398 mg/dL — ABNORMAL HIGH (ref 65–100)

## 2017-07-12 MED ORDER — NYSTATIN 100,000 UNIT/G TOPICAL POWDER
100000 unit/gram | Freq: Two times a day (BID) | CUTANEOUS | Status: DC
Start: 2017-07-12 — End: 2017-07-11

## 2017-07-12 MED ORDER — FLUCONAZOLE 100 MG TAB
100 mg | ORAL | Status: DC
Start: 2017-07-12 — End: 2017-07-11

## 2017-07-12 MED ORDER — NYSTATIN 100,000 UNIT/G OINTMENT
100000 unit/gram | Freq: Two times a day (BID) | CUTANEOUS | Status: DC
Start: 2017-07-12 — End: 2017-07-11

## 2017-07-12 MED ORDER — OXYCODONE 5 MG TAB
5 mg | ORAL | Status: AC
Start: 2017-07-12 — End: 2017-07-11
  Administered 2017-07-12: 01:00:00 via ORAL

## 2017-07-12 MED ORDER — LIDOCAINE 2 % MUCOSAL GEL
2 % | Status: AC
Start: 2017-07-12 — End: 2017-07-11
  Administered 2017-07-12: 02:00:00 via TOPICAL

## 2017-07-12 MED ORDER — IBUPROFEN 400 MG TAB
400 mg | ORAL | Status: AC
Start: 2017-07-12 — End: 2017-07-11
  Administered 2017-07-12: 01:00:00 via ORAL

## 2017-07-12 MED ORDER — CLOTRIMAZOLE 1 % VAGINAL CREAM
1 % | Freq: Every evening | VAGINAL | 0 refills | Status: DC
Start: 2017-07-12 — End: 2018-04-25

## 2017-07-12 MED ORDER — CLOTRIMAZOLE 1 % TOPICAL CREAM
1 % | Freq: Two times a day (BID) | CUTANEOUS | 2 refills | Status: DC
Start: 2017-07-12 — End: 2018-04-25

## 2017-07-12 MED FILL — OXYCODONE 5 MG TAB: 5 mg | ORAL | Qty: 1

## 2017-07-12 MED FILL — IBUPROFEN 400 MG TAB: 400 mg | ORAL | Qty: 2

## 2017-07-12 MED FILL — LIDOCAINE 2 % MUCOSAL GEL: 2 % | Qty: 5

## 2017-07-15 DIAGNOSIS — E1165 Type 2 diabetes mellitus with hyperglycemia: Secondary | ICD-10-CM

## 2017-07-16 ENCOUNTER — Inpatient Hospital Stay
Admit: 2017-07-16 | Discharge: 2017-07-16 | Disposition: A | Discharge status: Home / Self Care | Payer: Self-pay | Attending: Emergency Medicine

## 2017-07-16 LAB — GLUCOSE, POC
Glucose (POC): 490 mg/dL — ABNORMAL HIGH (ref 65–100)
Glucose (POC): 515 mg/dL — ABNORMAL HIGH (ref 65–100)
Glucose (POC): 324 mg/dL — ABNORMAL HIGH (ref 65–100)

## 2017-07-16 LAB — URINALYSIS W/ RFLX MICROSCOPIC
Bilirubin: NEGATIVE
Glucose: >1000 mg/dL — AB
Ketone: NEGATIVE mg/dL
Nitrites: NEGATIVE
Protein: NEGATIVE mg/dL
Specific gravity: 1.005 (ref 1.003–1.030)
Urobilinogen: 0.2 EU/dL (ref 0.2–1.0)
pH (UA): 6.5 (ref 5.0–8.0)

## 2017-07-16 LAB — METABOLIC PANEL, COMPREHENSIVE
A-G Ratio: 0.8 — ABNORMAL LOW (ref 1.1–2.2)
ALT (SGPT): 16 U/L (ref 12–78)
AST (SGOT): 8 U/L — ABNORMAL LOW (ref 15–37)
Albumin: 3.2 g/dL — ABNORMAL LOW (ref 3.5–5.0)
Alk. phosphatase: 118 U/L — ABNORMAL HIGH (ref 45–117)
Anion gap: 10 mmol/L (ref 5–15)
BUN/Creatinine ratio: 8 — ABNORMAL LOW (ref 12–20)
BUN: 7 MG/DL (ref 6–20)
Bilirubin, total: 0.2 MG/DL (ref 0.2–1.0)
CO2: 27 mmol/L (ref 21–32)
Calcium: 9.2 MG/DL (ref 8.5–10.1)
Chloride: 98 mmol/L (ref 97–108)
Creatinine: 0.83 MG/DL (ref 0.55–1.02)
GFR est AA: >60 mL/min/{1.73_m2} (ref >60)
GFR est non-AA: >60 mL/min/{1.73_m2} (ref >60)
Globulin: 4.1 g/dL — ABNORMAL HIGH (ref 2.0–4.0)
Glucose: 528 mg/dL — CR (ref 65–100)
Potassium: 4.1 mmol/L (ref 3.5–5.1)
Protein, total: 7.3 g/dL (ref 6.4–8.2)
Sodium: 135 mmol/L — ABNORMAL LOW (ref 136–145)

## 2017-07-16 LAB — URINE MICROSCOPIC ONLY

## 2017-07-16 LAB — CBC WITH AUTOMATED DIFF
ABS. BASOPHILS: 0.1 10*3/uL (ref 0.0–0.1)
ABS. EOSINOPHILS: 0.2 10*3/uL (ref 0.0–0.4)
ABS. IMM. GRANS.: 0 10*3/uL (ref 0.00–0.04)
ABS. LYMPHOCYTES: 2.7 10*3/uL (ref 0.8–3.5)
ABS. MONOCYTES: 0.3 10*3/uL (ref 0.0–1.0)
ABS. NEUTROPHILS: 3.1 10*3/uL (ref 1.8–8.0)
ABSOLUTE NRBC: 0 10*3/uL (ref 0.00–0.01)
BASOPHILS: 1 % (ref 0–1)
EOSINOPHILS: 3 % (ref 0–7)
HCT: 40.1 % (ref 35.0–47.0)
HGB: 13 g/dL (ref 11.5–16.0)
IMMATURE GRANULOCYTES: 1 % — ABNORMAL HIGH (ref 0.0–0.5)
LYMPHOCYTES: 43 % (ref 12–49)
MCH: 25.9 PG — ABNORMAL LOW (ref 26.0–34.0)
MCHC: 32.4 g/dL (ref 30.0–36.5)
MCV: 80 FL (ref 80.0–99.0)
MONOCYTES: 5 % (ref 5–13)
MPV: 10.5 FL (ref 8.9–12.9)
NEUTROPHILS: 48 % (ref 32–75)
NRBC: 0 PER 100 WBC
PLATELET: 418 10*3/uL — ABNORMAL HIGH (ref 150–400)
RBC: 5.01 M/uL (ref 3.80–5.20)
RDW: 12.1 % (ref 11.5–14.5)
WBC: 6.4 10*3/uL (ref 3.6–11.0)

## 2017-07-16 MED ORDER — SODIUM CHLORIDE 0.9% BOLUS IV
0.9 % | Freq: Once | INTRAVENOUS | Status: AC
Start: 2017-07-16 — End: 2017-07-16
  Administered 2017-07-16: 06:00:00 via INTRAVENOUS

## 2017-07-16 MED ORDER — INSULIN REGULAR HUMAN 100 UNIT/ML INJECTION
100 unit/mL | INTRAMUSCULAR | Status: AC
Start: 2017-07-16 — End: 2017-07-16
  Administered 2017-07-16: 07:00:00 via INTRAVENOUS

## 2017-07-16 MED ORDER — IPRATROPIUM-ALBUTEROL 2.5 MG-0.5 MG/3 ML NEB SOLUTION
2.5 mg-0.5 mg/3 ml | RESPIRATORY_TRACT | Status: AC
Start: 2017-07-16 — End: 2017-07-16
  Administered 2017-07-16: 06:00:00 via RESPIRATORY_TRACT

## 2017-07-16 MED FILL — IPRATROPIUM-ALBUTEROL 2.5 MG-0.5 MG/3 ML NEB SOLUTION: 2.5 mg-0.5 mg/3 ml | RESPIRATORY_TRACT | Qty: 3

## 2017-07-16 MED FILL — INSULIN REGULAR HUMAN 100 UNIT/ML INJECTION: 100 unit/mL | INTRAMUSCULAR | Qty: 1

## 2017-07-16 MED FILL — SODIUM CHLORIDE 0.9 % IV: INTRAVENOUS | Qty: 1000

## 2017-07-16 NOTE — ED Notes (Signed)
Patient ambulated to bathroom with steady gait

## 2017-07-16 NOTE — ED Provider Notes (Signed)
EMERGENCY DEPARTMENT HISTORY AND PHYSICAL EXAM      Date: 07/15/2017  Patient Name: Melinda Grant    History of Presenting Illness     Chief Complaint   Patient presents with   ??? High Blood Sugar     Patient says her glucose has been running 300s and 400s. Patient says she last checked her glucose three days ago. She is out of insulin.    ??? Wheezing     since yesterday. Patient says she has used her nebulizer and inhaler.      History Provided By: Patient    HPI: Melinda ClassShaniya Whetzel, 21 y.o. female with PMHx significant for asthma. DM, presents ambulatory to the ED with cc of elevated BS, ongoing for several days. Pt reports that her BS has been running in 300s-400s. She became concerned today with BS of 500 and notes to being out of insulin due to price of medication. Pt denies any other alleviating or exacerbating factors. She denies any pain in ED, there is no duration, timing, quality, severity, or associated sxs. specifically denies any HA, SOB, CP, abdominal pain, nausea, vomiting, fevers, chills, or diarrhea.    There are no other complaints, changes, or physical findings at this time.    PCP: Consuella LoseKing, Carla, MD  SHx: (+)smoker, (-)EtOH use, (-)illicit drug use  Current Outpatient Medications   Medication Sig Dispense Refill   ??? clotrimazole (LOTRIMIN) 1 % topical cream Apply  to affected area two (2) times a day. Apply twice a day for 2-4 weeks 1 Tube 2   ??? clotrimazole (MYCELEX) 1 % vaginal cream Insert 1 Applicator into vagina nightly. 20 g 0   ??? albuterol sulfate (PROVENTIL;VENTOLIN) 2.5 mg/0.5 mL Nebu 2.5 mg by Nebulization route as needed.     ??? albuterol (PROVENTIL, VENTOLIN) 90 mcg/Actuation inhaler Take 2 Puffs by inhalation every six (6) hours as needed.     ??? beclomethasone (QVAR) 40 mcg/Actuation inhaler Take 1 Puff by inhalation two (2) times a day.     ??? loratadine (CLARITIN) 10 mg tablet Take 10 mg by mouth daily.     ??? montelukast (SINGULAIR) 10 mg tablet Take 10 mg by mouth daily.      ??? insulin glargine (LANTUS) 100 unit/mL injection 50 Units by SubCUTAneous route once. Daily in AM      ??? insulin (NOVOLIN 70/30) 100 unit/mL (70-30) injection by SubCUTAneous route once. Sliding scale, with meals and at bedtime          Past History     Past Medical History:  Past Medical History:   Diagnosis Date   ??? Asthma    ??? Diabetes (HCC)        Past Surgical History:  Past Surgical History:   Procedure Laterality Date   ??? HX HEENT      dental       Family History:  History reviewed. No pertinent family history.    Social History:  Social History     Tobacco Use   ??? Smoking status: Current Every Day Smoker   Substance Use Topics   ??? Alcohol use: No   ??? Drug use: No       Allergies:  Allergies   Allergen Reactions   ??? Bees [Sting, Bee] Swelling   ??? Strawberry Itching     Allergic to "strawberry and banana mixed"     Review of Systems   Review of Systems   Constitutional: Negative for chills and fever.   HENT: Negative  for congestion and rhinorrhea.    Eyes: Negative for discharge and redness.   Respiratory: Negative for cough and shortness of breath.    Cardiovascular: Negative for chest pain.   Gastrointestinal: Negative for abdominal distention, abdominal pain, constipation, diarrhea, nausea and vomiting.   Genitourinary: Negative for decreased urine volume and urgency.   Musculoskeletal: Negative for arthralgias and back pain.   Skin: Negative for rash.   Neurological: Negative for dizziness, weakness, light-headedness, numbness and headaches.   Psychiatric/Behavioral: Negative for confusion and decreased concentration.   All other systems reviewed and are negative.    Physical Exam   Physical Exam   Constitutional: She is oriented to person, place, and time. She appears well-developed and well-nourished.   HENT:   Head: Normocephalic and atraumatic.   Mouth/Throat: Oropharynx is clear and moist.   Eyes: Conjunctivae and EOM are normal.   Neck: Normal range of motion and full passive range of motion without  pain. Neck supple.   Cardiovascular: Normal rate, regular rhythm, S1 normal, S2 normal, normal heart sounds, intact distal pulses and normal pulses.   No murmur heard.  Pulmonary/Chest: Effort normal and breath sounds normal. No respiratory distress. She has no wheezes.   Abdominal: Soft. Normal appearance and bowel sounds are normal. She exhibits no distension. There is no tenderness. There is no rebound.   Musculoskeletal: Normal range of motion.   Neurological: She is alert and oriented to person, place, and time. She has normal strength.   Skin: Skin is warm, dry and intact. No rash noted.   Psychiatric: She has a normal mood and affect. Her speech is normal and behavior is normal. Judgment and thought content normal.   Nursing note and vitals reviewed.    Diagnostic Study Results     Labs -     No results found for this or any previous visit (from the past 12 hour(s)).    Radiologic Studies -   No orders to display     Medical Decision Making   I am the first provider for this patient.    I reviewed the vital signs, available nursing notes, past medical history, past surgical history, family history and social history.    Vital Signs-Reviewed the patient's vital signs.  No data found.  Pulse Oximetry Analysis - 98% on RA    Records Reviewed: Nursing Notes, Old Medical Records and Previous Laboratory Studies    Provider Notes (Medical Decision Making):   DDx: hyperglycemia, DKA    ED Course:   Initial assessment performed. The patients presenting problems have been discussed, and they are in agreement with the care plan formulated and outlined with them.  I have encouraged them to ask questions as they arise throughout their visit.       Critical Care Time: 0    Disposition:  Discharge Note:    The pt is ready for discharge. The pt's signs, symptoms, diagnosis, and discharge instructions have been discussed and pt has conveyed their understanding. The pt is to follow up as recommended or return to ER  should their symptoms worsen. Plan has been discussed and pt is in agreement.    PLAN:  1.   Discharge Medication List as of 07/16/2017  3:17 AM        2.   Follow-up Information     Follow up With Specialties Details Why Contact Info    Consuella Lose, MD Pediatrics Call  [address] [place] [address]  [phone]      [facility] Emergency Medicine  As needed, If symptoms worsen [address]  [place] [id number]  [phone]        Return to ED if worse   Diagnosis     Clinical Impression:   1. Hyperglycemia        Attestations:  This note is prepared by Brynda PeonJocelyn Canseco Neri, acting as a Scribe for Gwenyth BenderVincent E. Mackenna Kamer, MD.    Gwenyth BenderVincent E. Elika Godar, MD: The scribe's documentation has been prepared under my direction and personally reviewed by me in its entirety. I confirm that the notes above accurately reflects all work, treatment, procedures, and medical decision making performed by me.    This note will not be viewable in MyChart.

## 2017-07-16 NOTE — ED Notes (Addendum)
Patient presents to ED with c/o high blood sugar and wheezing. Patient states that She ran out of insulin  3 days ago and began to wheezing yesterday. Patient alert and oriented, respirations even and unlabored.      Emergency Department Nursing Plan of Care       The Nursing Plan of Care is developed from the Nursing assessment and Emergency Department Attending provider initial evaluation.  The plan of care may be reviewed in the ???ED Provider note???.    The Plan of Care was developed with the following considerations:   Patient / Family readiness to learn indicated XB:[id number]:verbalized understanding  Persons(s) to be included in education: patient  Barriers to Learning/Limitations:No    Signed     Dionna Varney BaasM Coleman, RN    07/16/2017   12:03 AM

## 2017-07-16 NOTE — ED Notes (Signed)
Verbal shift change report given to Brittney,RN (Cabin crewoncoming nurse) by Lorel Monacoionna,RN (offgoing nurse). Report included the following information SBAR, Kardex, ED Summary, [facility] and Recent Results.

## 2017-07-16 NOTE — ED Notes (Signed)
Patient educated on discharge instructions . Patient verbalized understanding of eduction. Patient given discharge instructions. Patient ambulated out of ED with her friend. No acute distress noted.

## 2018-04-25 ENCOUNTER — Emergency Department: Admit: 2018-04-26 | Payer: MEDICAID | Primary: Pediatrics

## 2018-04-25 DIAGNOSIS — J4541 Moderate persistent asthma with (acute) exacerbation: Secondary | ICD-10-CM

## 2018-04-25 NOTE — ED Triage Notes (Signed)
Pt reports SOB, cough, mid chest tightness, bilateral flank pain, and urinary frequency X 2 days. Pt reports no relief w/ inhaler use. Pt reports that she is a diabetic and has not checked her BG or taken her diabetic meds.

## 2018-04-25 NOTE — ED Notes (Signed)
[name] aware of blood glucose.

## 2018-04-25 NOTE — ED Notes (Signed)
Pt reports SOB, cough, mid chest tightness, bilateral flank pain, and urinary frequency X 2 days. Pt reports no relief w/ inhaler use. Pt reports that she is a diabetic and has not checked her BG or taken her diabetic meds.

## 2018-04-26 ENCOUNTER — Emergency Department: Admit: 2018-04-26 | Payer: MEDICAID | Primary: Pediatrics

## 2018-04-26 ENCOUNTER — Inpatient Hospital Stay
Admit: 2018-04-26 | Discharge: 2018-04-27 | Discharge status: Against Medical Advice | Payer: MEDICAID | Attending: Internal Medicine | Admitting: Internal Medicine

## 2018-04-26 LAB — BASIC METABOLIC PANEL
Anion Gap: 11 mmol/L (ref 5–15)
BUN: 6 MG/DL (ref 6–20)
Bun/Cre Ratio: 9 — ABNORMAL LOW (ref 12–20)
CO2: 22 mmol/L (ref 21–32)
Calcium: 8.6 MG/DL (ref 8.5–10.1)
Chloride: 102 mmol/L (ref 97–108)
Creatinine: 0.65 MG/DL (ref 0.55–1.02)
EGFR IF NonAfrican American: >60 mL/min/{1.73_m2} (ref >60)
GFR African American: >60 mL/min/{1.73_m2} (ref >60)
Glucose: 419 mg/dL — ABNORMAL HIGH (ref 65–100)
Potassium: 3.9 mmol/L (ref 3.5–5.1)
Sodium: 135 mmol/L — ABNORMAL LOW (ref 136–145)

## 2018-04-26 LAB — EKG 12-LEAD
Atrial Rate: 112 {beats}/min
Atrial Rate: 134 {beats}/min
P Axis: 71 degrees
P Axis: 72 degrees
P-R Interval: 146 ms
P-R Interval: 160 ms
Q-T Interval: 290 ms
Q-T Interval: 336 ms
QRS Duration: 76 ms
QRS Duration: 78 ms
QTc Calculation (Bazett): 433 ms
QTc Calculation (Bazett): 458 ms
R Axis: 63 degrees
R Axis: 74 degrees
T Axis: 54 degrees
T Axis: 59 degrees
Ventricular Rate: 112 {beats}/min
Ventricular Rate: 134 {beats}/min

## 2018-04-26 LAB — SALICYLATE
Salicylate level: <1.7 MG/DL — ABNORMAL LOW (ref 2.8–20.0)
Salicylate: <1.7 MG/DL — ABNORMAL LOW (ref 2.8–20.0)

## 2018-04-26 LAB — URINALYSIS W/ REFLEX CULTURE
BACTERIA, URINE: NEGATIVE /hpf
Bacteria: NEGATIVE /hpf
Bilirubin, Urine: NEGATIVE
Bilirubin: NEGATIVE
Blood, Urine: NEGATIVE
Blood: NEGATIVE
Glucose, Ur: >1000 mg/dL — AB
Glucose: >1000 mg/dL — AB
Ketone: NEGATIVE mg/dL
Ketones, Urine: NEGATIVE mg/dL
Nitrite, Urine: NEGATIVE
Nitrites: NEGATIVE
Protein, UA: NEGATIVE mg/dL
Protein: NEGATIVE mg/dL
Specific Gravity, UA: 1.005 (ref 1.003–1.030)
Specific gravity: 1.005 (ref 1.003–1.030)
Urobilinogen, UA, POCT: 0.2 EU/dL (ref 0.2–1.0)
Urobilinogen: 0.2 EU/dL (ref 0.2–1.0)
pH (UA): 6 (ref 5.0–8.0)
pH, UA: 6 (ref 5.0–8.0)

## 2018-04-26 LAB — POCT GLUCOSE
POC Glucose: 576 mg/dL — ABNORMAL HIGH (ref 65–100)
POC Glucose: 597 mg/dL — ABNORMAL HIGH (ref 65–100)
POC Glucose: 344 mg/dL — ABNORMAL HIGH (ref 65–100)
POC Glucose: 400 mg/dL — ABNORMAL HIGH (ref 65–100)
POC Glucose: 469 mg/dL — ABNORMAL HIGH (ref 65–100)
POC Glucose: 528 mg/dL — ABNORMAL HIGH (ref 65–100)
POC Glucose: 424 mg/dL — ABNORMAL HIGH (ref 65–100)
POC Glucose: 343 mg/dL — ABNORMAL HIGH (ref 65–100)

## 2018-04-26 LAB — CBC WITH AUTO DIFFERENTIAL
Basophils %: 0 % (ref 0–1)
Basophils Absolute: 0 10*3/uL (ref 0.0–0.1)
Eosinophils %: 1 % (ref 0–7)
Eosinophils Absolute: 0.1 10*3/uL (ref 0.0–0.4)
Granulocyte Absolute Count: 0 10*3/uL (ref 0.00–0.04)
Hematocrit: 38.2 % (ref 35.0–47.0)
Hemoglobin: 12.4 g/dL (ref 11.5–16.0)
Immature Granulocytes: 0 % (ref 0.0–0.5)
Lymphocytes %: 21 % (ref 12–49)
Lymphocytes Absolute: 2.2 10*3/uL (ref 0.8–3.5)
MCH: 26.4 PG (ref 26.0–34.0)
MCHC: 32.5 g/dL (ref 30.0–36.5)
MCV: 81.4 FL (ref 80.0–99.0)
MPV: 10.5 FL (ref 8.9–12.9)
Monocytes %: 4 % — ABNORMAL LOW (ref 5–13)
Monocytes Absolute: 0.4 10*3/uL (ref 0.0–1.0)
NRBC Absolute: 0 10*3/uL (ref 0.00–0.01)
Neutrophils %: 74 % (ref 32–75)
Neutrophils Absolute: 7.5 10*3/uL (ref 1.8–8.0)
Nucleated RBCs: 0 PER 100 WBC
Platelets: 312 10*3/uL (ref 150–400)
RBC: 4.69 M/uL (ref 3.80–5.20)
RDW: 12.4 % (ref 11.5–14.5)
WBC: 10.2 10*3/uL (ref 3.6–11.0)

## 2018-04-26 LAB — COMPREHENSIVE METABOLIC PANEL
ALT: 10 U/L — ABNORMAL LOW (ref 12–78)
AST: 4 U/L — ABNORMAL LOW (ref 15–37)
Albumin/Globulin Ratio: 0.9 — ABNORMAL LOW (ref 1.1–2.2)
Albumin: 3.1 g/dL — ABNORMAL LOW (ref 3.5–5.0)
Alkaline Phosphatase: 96 U/L (ref 45–117)
Anion Gap: 9 mmol/L (ref 5–15)
BUN: 7 MG/DL (ref 6–20)
Bun/Cre Ratio: 8 — ABNORMAL LOW (ref 12–20)
CO2: 27 mmol/L (ref 21–32)
Calcium: 8.9 MG/DL (ref 8.5–10.1)
Chloride: 99 mmol/L (ref 97–108)
Creatinine: 0.88 MG/DL (ref 0.55–1.02)
EGFR IF NonAfrican American: >60 mL/min/{1.73_m2} (ref >60)
GFR African American: >60 mL/min/{1.73_m2} (ref >60)
Globulin: 3.5 g/dL (ref 2.0–4.0)
Glucose: 599 mg/dL (ref 65–100)
Potassium: 3 mmol/L — ABNORMAL LOW (ref 3.5–5.1)
Sodium: 135 mmol/L — ABNORMAL LOW (ref 136–145)
Total Bilirubin: 0.2 MG/DL (ref 0.2–1.0)
Total Protein: 6.6 g/dL (ref 6.4–8.2)

## 2018-04-26 LAB — LIPID PANEL
CHOL/HDL Ratio: 3.6 (ref 0.0–5.0)
Chol/HDL Ratio: 3.6 (ref 0.0–5.0)
Cholesterol, Total: 138 MG/DL (ref <200)
Cholesterol, total: 138 MG/DL (ref <200)
HDL Cholesterol: 38 MG/DL
HDL: 38 MG/DL
LDL Calculated: 89.6 MG/DL (ref 0–100)
LDL, calculated: 89.6 MG/DL (ref 0–100)
Triglyceride: 52 MG/DL (ref <150)
Triglycerides: 52 MG/DL (ref <150)
VLDL Cholesterol Calculated: 10.4 MG/DL
VLDL, calculated: 10.4 MG/DL

## 2018-04-26 LAB — HEMOGLOBIN A1C W/EAG
Hemoglobin A1C: 13.9 % — ABNORMAL HIGH (ref 4.2–6.3)
eAG: 352 mg/dL

## 2018-04-26 LAB — VENOUS BLOOD GAS
HCO3, Venous: 26 mmol/L (ref 23–28)
O2 Sat, Ven: 40 % — ABNORMAL LOW (ref 65–88)
PO2, Ven: 25 mmHg (ref 25–40)
VENOUS BASE DEFICIT: 0.6 mmol/L
VENOUS BICARBONATE: 26 mmol/L (ref 23–28)
VENOUS O2 SATURATION: 40 % — ABNORMAL LOW (ref 65–88)
VENOUS PCO2: 51 mmHg (ref 41–51)
VENOUS PH: 7.33 (ref 7.32–7.42)
VENOUS PO2: 25 mmHg (ref 25–40)
Venous Base Deficit: 0.6 mmol/L
pCO2, Ven: 51 mmHg (ref 41–51)
pH, Ven: 7.33 (ref 7.32–7.42)

## 2018-04-26 LAB — DRUG SCREEN, URINE
AMPHETAMINES: NEGATIVE
Amphetamine Screen, Urine: NEGATIVE
BARBITURATES: NEGATIVE
BENZODIAZEPINES: NEGATIVE
Barbiturate Screen, Urine: NEGATIVE
Benzodiazepine Screen, Urine: NEGATIVE
COCAINE: NEGATIVE
Cocaine Screen Urine: NEGATIVE
METHADONE: NEGATIVE
Methadone Screen, Urine: NEGATIVE
OPIATES: NEGATIVE
Opiate Screen, Urine: NEGATIVE
PCP Screen, Urine: NEGATIVE
PCP(PHENCYCLIDINE): NEGATIVE
THC (TH-CANNABINOL): NEGATIVE
THC Screen, Urine: NEGATIVE

## 2018-04-26 LAB — PHOSPHORUS
Phosphorus: 2.6 MG/DL (ref 2.6–4.7)
Phosphorus: 2.6 MG/DL (ref 2.6–4.7)
Phosphorus: 3.5 MG/DL (ref 2.6–4.7)
Phosphorus: 3.5 MG/DL (ref 2.6–4.7)

## 2018-04-26 LAB — RAPID INFLUENZA A/B ANTIGENS
Flu A Antigen: NEGATIVE
Influenza B Antigen: NEGATIVE

## 2018-04-26 LAB — LACTIC ACID
Lactic Acid: 2.4 MMOL/L (ref 0.4–2.0)
Lactic Acid: 2.6 MMOL/L (ref 0.4–2.0)
Lactic Acid: 2.1 MMOL/L (ref 0.4–2.0)
Lactic acid: 2.4 MMOL/L — CR (ref 0.4–2.0)
Lactic acid: 2.6 MMOL/L — CR (ref 0.4–2.0)
Lactic acid: 2.1 MMOL/L — CR (ref 0.4–2.0)

## 2018-04-26 LAB — MAGNESIUM
Magnesium: 1.4 mg/dL — ABNORMAL LOW (ref 1.6–2.4)
Magnesium: 1.4 mg/dL — ABNORMAL LOW (ref 1.6–2.4)
Magnesium: 3 mg/dL — ABNORMAL HIGH (ref 1.6–2.4)
Magnesium: 3 mg/dL — ABNORMAL HIGH (ref 1.6–2.4)

## 2018-04-26 LAB — POC TROPONIN-I
POC Troponin I: <0.04 ng/mL (ref 0.00–0.08)
POC Troponin I: <0.04 ng/mL (ref 0.00–0.08)
Troponin-I (POC): <0.04 ng/mL (ref 0.00–0.08)
Troponin-I (POC): <0.04 ng/mL (ref 0.00–0.08)

## 2018-04-26 LAB — HCG URINE, QL. - POC
HCG, Pregnancy, Urine, POC: NEGATIVE
Pregnancy test,urine (POC): NEGATIVE

## 2018-04-26 LAB — GLUCOSE, POC
Glucose (POC): 576 mg/dL — ABNORMAL HIGH (ref 65–100)
Glucose (POC): 597 mg/dL — ABNORMAL HIGH (ref 65–100)
Glucose (POC): 344 mg/dL — ABNORMAL HIGH (ref 65–100)
Glucose (POC): 400 mg/dL — ABNORMAL HIGH (ref 65–100)
Glucose (POC): 469 mg/dL — ABNORMAL HIGH (ref 65–100)
Glucose (POC): 528 mg/dL — ABNORMAL HIGH (ref 65–100)
Glucose (POC): 424 mg/dL — ABNORMAL HIGH (ref 65–100)
Glucose (POC): 343 mg/dL — ABNORMAL HIGH (ref 65–100)

## 2018-04-26 LAB — CBC WITH AUTOMATED DIFF
ABS. BASOPHILS: 0 10*3/uL (ref 0.0–0.1)
ABS. EOSINOPHILS: 0.1 10*3/uL (ref 0.0–0.4)
ABS. IMM. GRANS.: 0 10*3/uL (ref 0.00–0.04)
ABS. LYMPHOCYTES: 2.2 10*3/uL (ref 0.8–3.5)
ABS. MONOCYTES: 0.4 10*3/uL (ref 0.0–1.0)
ABS. NEUTROPHILS: 7.5 10*3/uL (ref 1.8–8.0)
ABSOLUTE NRBC: 0 10*3/uL (ref 0.00–0.01)
BASOPHILS: 0 % (ref 0–1)
EOSINOPHILS: 1 % (ref 0–7)
HCT: 38.2 % (ref 35.0–47.0)
HGB: 12.4 g/dL (ref 11.5–16.0)
IMMATURE GRANULOCYTES: 0 % (ref 0.0–0.5)
LYMPHOCYTES: 21 % (ref 12–49)
MCH: 26.4 PG (ref 26.0–34.0)
MCHC: 32.5 g/dL (ref 30.0–36.5)
MCV: 81.4 FL (ref 80.0–99.0)
MONOCYTES: 4 % — ABNORMAL LOW (ref 5–13)
MPV: 10.5 FL (ref 8.9–12.9)
NEUTROPHILS: 74 % (ref 32–75)
NRBC: 0 PER 100 WBC
PLATELET: 312 10*3/uL (ref 150–400)
RBC: 4.69 M/uL (ref 3.80–5.20)
RDW: 12.4 % (ref 11.5–14.5)
WBC: 10.2 10*3/uL (ref 3.6–11.0)

## 2018-04-26 LAB — HEMOGLOBIN A1C WITH EAG
Est. average glucose: 352 mg/dL
Hemoglobin A1c: 13.9 % — ABNORMAL HIGH (ref 4.2–6.3)

## 2018-04-26 LAB — METABOLIC PANEL, BASIC
Anion gap: 11 mmol/L (ref 5–15)
BUN/Creatinine ratio: 9 — ABNORMAL LOW (ref 12–20)
BUN: 6 MG/DL (ref 6–20)
CO2: 22 mmol/L (ref 21–32)
Calcium: 8.6 MG/DL (ref 8.5–10.1)
Chloride: 102 mmol/L (ref 97–108)
Creatinine: 0.65 MG/DL (ref 0.55–1.02)
GFR est AA: >60 mL/min/{1.73_m2} (ref >60)
GFR est non-AA: >60 mL/min/{1.73_m2} (ref >60)
Glucose: 419 mg/dL — ABNORMAL HIGH (ref 65–100)
Potassium: 3.9 mmol/L (ref 3.5–5.1)
Sodium: 135 mmol/L — ABNORMAL LOW (ref 136–145)

## 2018-04-26 LAB — EKG, 12 LEAD, SUBSEQUENT
Atrial Rate: 112 {beats}/min
Calculated P Axis: 72 degrees
Calculated R Axis: 74 degrees
Calculated T Axis: 59 degrees
P-R Interval: 160 ms
Q-T Interval: 336 ms
QRS Duration: 78 ms
QTC Calculation (Bezet): 458 ms
Ventricular Rate: 112 {beats}/min

## 2018-04-26 LAB — INFLUENZA A & B AG (RAPID TEST)
Influenza A Antigen: NEGATIVE
Influenza B Antigen: NEGATIVE

## 2018-04-26 LAB — EKG, 12 LEAD, INITIAL
Atrial Rate: 134 {beats}/min
Calculated P Axis: 71 degrees
Calculated R Axis: 63 degrees
Calculated T Axis: 54 degrees
P-R Interval: 146 ms
Q-T Interval: 290 ms
QRS Duration: 76 ms
QTC Calculation (Bezet): 433 ms
Ventricular Rate: 134 {beats}/min

## 2018-04-26 LAB — METABOLIC PANEL, COMPREHENSIVE
A-G Ratio: 0.9 — ABNORMAL LOW (ref 1.1–2.2)
ALT (SGPT): 10 U/L — ABNORMAL LOW (ref 12–78)
AST (SGOT): 4 U/L — ABNORMAL LOW (ref 15–37)
Albumin: 3.1 g/dL — ABNORMAL LOW (ref 3.5–5.0)
Alk. phosphatase: 96 U/L (ref 45–117)
Anion gap: 9 mmol/L (ref 5–15)
BUN/Creatinine ratio: 8 — ABNORMAL LOW (ref 12–20)
BUN: 7 MG/DL (ref 6–20)
Bilirubin, total: 0.2 MG/DL (ref 0.2–1.0)
CO2: 27 mmol/L (ref 21–32)
Calcium: 8.9 MG/DL (ref 8.5–10.1)
Chloride: 99 mmol/L (ref 97–108)
Creatinine: 0.88 MG/DL (ref 0.55–1.02)
GFR est AA: >60 mL/min/{1.73_m2} (ref >60)
GFR est non-AA: >60 mL/min/{1.73_m2} (ref >60)
Globulin: 3.5 g/dL (ref 2.0–4.0)
Glucose: 599 mg/dL — CR (ref 65–100)
Potassium: 3 mmol/L — ABNORMAL LOW (ref 3.5–5.1)
Protein, total: 6.6 g/dL (ref 6.4–8.2)
Sodium: 135 mmol/L — ABNORMAL LOW (ref 136–145)

## 2018-04-26 MED ORDER — INSULIN GLARGINE 100 UNIT/ML INJECTION
100 unit/mL | Freq: Every day | SUBCUTANEOUS | Status: DC
Start: 2018-04-26 — End: 2018-04-26
  Administered 2018-04-26: 12:00:00 via SUBCUTANEOUS

## 2018-04-26 MED ORDER — INSULIN LISPRO 100 UNIT/ML INJECTION
100 unit/mL | Freq: Four times a day (QID) | SUBCUTANEOUS | Status: DC
Start: 2018-04-26 — End: 2018-04-27
  Administered 2018-04-26 – 2018-04-27 (×3): via SUBCUTANEOUS

## 2018-04-26 MED ORDER — INSULIN LISPRO 100 UNIT/ML INJECTION
100 unit/mL | Freq: Three times a day (TID) | SUBCUTANEOUS | Status: DC
Start: 2018-04-26 — End: 2018-04-27

## 2018-04-26 MED ORDER — INSULIN REGULAR HUMAN 100 UNIT/ML INJECTION
100 unit/mL | INTRAMUSCULAR | Status: AC
Start: 2018-04-26 — End: 2018-04-26
  Administered 2018-04-26: 06:00:00 via INTRAVENOUS

## 2018-04-26 MED ORDER — METHYLPREDNISOLONE (PF) 125 MG/2 ML IJ SOLR
125 mg/2 mL | INTRAMUSCULAR | Status: AC
Start: 2018-04-26 — End: 2018-04-26
  Administered 2018-04-26: 10:00:00 via INTRAVENOUS

## 2018-04-26 MED ORDER — SODIUM CHLORIDE 0.9% BOLUS IV
0.9 % | Freq: Once | INTRAVENOUS | Status: AC
Start: 2018-04-26 — End: 2018-04-26
  Administered 2018-04-26: 08:00:00 via INTRAVENOUS

## 2018-04-26 MED ORDER — MAGNESIUM SULFATE 2 GRAM/50 ML IVPB
2 gram/50 mL (4 %) | Freq: Once | INTRAVENOUS | Status: AC
Start: 2018-04-26 — End: 2018-04-26
  Administered 2018-04-26: 11:00:00 via INTRAVENOUS

## 2018-04-26 MED ORDER — SODIUM CHLORIDE 0.9% BOLUS IV
0.9 % | Freq: Once | INTRAVENOUS | Status: AC
Start: 2018-04-26 — End: 2018-04-26
  Administered 2018-04-26: 05:00:00 via INTRAVENOUS

## 2018-04-26 MED ORDER — FLUTICASONE 44 MCG/ACTUATION AEROSOL INHALER
44 mcg/actuation | Freq: Two times a day (BID) | RESPIRATORY_TRACT | Status: DC
Start: 2018-04-26 — End: 2018-04-27
  Administered 2018-04-26: 14:00:00 via RESPIRATORY_TRACT

## 2018-04-26 MED ORDER — HEPARIN (PORCINE) 5,000 UNIT/ML IJ SOLN
5000 unit/mL | Freq: Three times a day (TID) | INTRAMUSCULAR | Status: DC
Start: 2018-04-26 — End: 2018-04-27
  Administered 2018-04-26: 12:00:00 via SUBCUTANEOUS

## 2018-04-26 MED ORDER — PREDNISONE 20 MG TAB
20 mg | Freq: Every day | ORAL | Status: DC
Start: 2018-04-26 — End: 2018-04-26

## 2018-04-26 MED ORDER — IPRATROPIUM-ALBUTEROL 2.5 MG-0.5 MG/3 ML NEB SOLUTION
2.5 mg-0.5 mg/3 ml | RESPIRATORY_TRACT | Status: AC
Start: 2018-04-26 — End: 2018-04-26
  Administered 2018-04-26: 08:00:00 via RESPIRATORY_TRACT

## 2018-04-26 MED ORDER — LORAZEPAM 2 MG/ML IJ SOLN
2 mg/mL | INTRAMUSCULAR | Status: DC
Start: 2018-04-26 — End: 2018-04-26

## 2018-04-26 MED ORDER — NICOTINE 14 MG/24 HR DAILY PATCH
14 mg/24 hr | TRANSDERMAL | Status: DC
Start: 2018-04-26 — End: 2018-04-26

## 2018-04-26 MED ORDER — GUAIFENESIN 600 MG TABLET,EXTENDED RELEASE BIPHASIC 12 HR
600 mg | Freq: Two times a day (BID) | ORAL | Status: DC
Start: 2018-04-26 — End: 2018-04-27
  Administered 2018-04-26 – 2018-04-27 (×2): via ORAL

## 2018-04-26 MED ORDER — INSULIN LISPRO 100 UNIT/ML INJECTION
100 unit/mL | Freq: Three times a day (TID) | SUBCUTANEOUS | Status: DC
Start: 2018-04-26 — End: 2018-04-26

## 2018-04-26 MED ORDER — MAGNESIUM SULFATE 2 GRAM/50 ML IVPB
2 gram/50 mL (4 %) | INTRAVENOUS | Status: AC
Start: 2018-04-26 — End: 2018-04-26

## 2018-04-26 MED ORDER — ACETAMINOPHEN 325 MG TABLET
325 mg | ORAL | Status: DC | PRN
Start: 2018-04-26 — End: 2018-04-27

## 2018-04-26 MED ORDER — POTASSIUM CHLORIDE SR 10 MEQ TAB
10 mEq | ORAL | Status: AC
Start: 2018-04-26 — End: 2018-04-26

## 2018-04-26 MED ORDER — SODIUM CHLORIDE 0.9 % IV
INTRAVENOUS | Status: DC
Start: 2018-04-26 — End: 2018-04-27
  Administered 2018-04-26: 12:00:00 via INTRAVENOUS

## 2018-04-26 MED ORDER — INSULIN GLARGINE 100 UNIT/ML INJECTION
100 unit/mL | Freq: Two times a day (BID) | SUBCUTANEOUS | Status: DC
Start: 2018-04-26 — End: 2018-04-27
  Administered 2018-04-27: 01:00:00 via SUBCUTANEOUS

## 2018-04-26 MED ORDER — DEXTROSE 10% IN WATER (D10W) IV
10 % | INTRAVENOUS | Status: DC | PRN
Start: 2018-04-26 — End: 2018-04-27

## 2018-04-26 MED ORDER — POTASSIUM CHLORIDE SR 10 MEQ TAB
10 mEq | ORAL | Status: AC
Start: 2018-04-26 — End: 2018-04-26
  Administered 2018-04-26: 06:00:00 via ORAL

## 2018-04-26 MED ORDER — INSULIN REGULAR HUMAN 100 UNIT/ML INJECTION
100 unit/mL | Freq: Once | INTRAMUSCULAR | Status: DC
Start: 2018-04-26 — End: 2018-04-26
  Administered 2018-04-26: 14:00:00 via INTRAVENOUS

## 2018-04-26 MED ORDER — INSULIN REGULAR HUMAN 100 UNIT/ML INJECTION
100 unit/mL | INTRAMUSCULAR | Status: AC
Start: 2018-04-26 — End: 2018-04-26

## 2018-04-26 MED ORDER — SODIUM CHLORIDE 0.9% BOLUS IV
0.9 % | Freq: Once | INTRAVENOUS | Status: AC
Start: 2018-04-26 — End: 2018-04-26
  Administered 2018-04-26: 12:00:00 via INTRAVENOUS

## 2018-04-26 MED ORDER — IPRATROPIUM-ALBUTEROL 2.5 MG-0.5 MG/3 ML NEB SOLUTION
2.5 mg-0.5 mg/3 ml | RESPIRATORY_TRACT | Status: AC
Start: 2018-04-26 — End: 2018-04-26
  Administered 2018-04-26: 05:00:00 via RESPIRATORY_TRACT

## 2018-04-26 MED ORDER — POTASSIUM CHLORIDE SR 10 MEQ TAB
10 mEq | ORAL | Status: AC
Start: 2018-04-26 — End: 2018-04-26
  Administered 2018-04-26: 10:00:00 via ORAL

## 2018-04-26 MED ORDER — SODIUM CHLORIDE 0.9% BOLUS IV
0.9 % | Freq: Once | INTRAVENOUS | Status: AC
Start: 2018-04-26 — End: 2018-04-26
  Administered 2018-04-26: 06:00:00 via INTRAVENOUS

## 2018-04-26 MED ORDER — IOPAMIDOL 76 % IV SOLN
370 mg iodine /mL (76 %) | Freq: Once | INTRAVENOUS | Status: AC
Start: 2018-04-26 — End: 2018-04-26
  Administered 2018-04-26: 06:00:00 via INTRAVENOUS

## 2018-04-26 MED ORDER — LORATADINE 10 MG TAB
10 mg | Freq: Every day | ORAL | Status: DC
Start: 2018-04-26 — End: 2018-04-26
  Administered 2018-04-26: 14:00:00 via ORAL

## 2018-04-26 MED ORDER — SODIUM CHLORIDE 0.9 % IJ SYRG
Freq: Three times a day (TID) | INTRAMUSCULAR | Status: DC
Start: 2018-04-26 — End: 2018-04-27
  Administered 2018-04-26 – 2018-04-27 (×3): via INTRAVENOUS

## 2018-04-26 MED ORDER — GLUCAGON 1 MG INJECTION
1 mg | INTRAMUSCULAR | Status: DC | PRN
Start: 2018-04-26 — End: 2018-04-27

## 2018-04-26 MED ORDER — FLUTICASONE FUROATE 100 MCG/ACTUATION BREATH ACTIVATED INHALER
100 mcg/actuation | Freq: Every day | RESPIRATORY_TRACT | Status: DC
Start: 2018-04-26 — End: 2018-04-26

## 2018-04-26 MED ORDER — IPRATROPIUM-ALBUTEROL 2.5 MG-0.5 MG/3 ML NEB SOLUTION
2.5 mg-0.5 mg/3 ml | Freq: Four times a day (QID) | RESPIRATORY_TRACT | Status: DC
Start: 2018-04-26 — End: 2018-04-27
  Administered 2018-04-26 (×3): via RESPIRATORY_TRACT

## 2018-04-26 MED ORDER — MONTELUKAST 10 MG TAB
10 mg | Freq: Every day | ORAL | Status: DC
Start: 2018-04-26 — End: 2018-04-27
  Administered 2018-04-26: 14:00:00 via ORAL

## 2018-04-26 MED ORDER — BENZOCAINE-MENTHOL 15 MG-10 MG LOZENGES
15-10 mg | Status: DC | PRN
Start: 2018-04-26 — End: 2018-04-27

## 2018-04-26 MED ORDER — INSULIN LISPRO 100 UNIT/ML INJECTION
100 unit/mL | Freq: Three times a day (TID) | SUBCUTANEOUS | Status: DC
Start: 2018-04-26 — End: 2018-04-26
  Administered 2018-04-26: 22:00:00 via SUBCUTANEOUS

## 2018-04-26 MED ORDER — MORPHINE 2 MG/ML INJECTION
2 mg/mL | INTRAMUSCULAR | Status: AC
Start: 2018-04-26 — End: 2018-04-26
  Administered 2018-04-26: 07:00:00 via INTRAVENOUS

## 2018-04-26 MED ORDER — ALBUTEROL SULFATE 0.083 % (0.83 MG/ML) SOLN FOR INHALATION
2.5 mg /3 mL (0.083 %) | RESPIRATORY_TRACT | Status: AC
Start: 2018-04-26 — End: 2018-04-26
  Administered 2018-04-26: 10:00:00 via RESPIRATORY_TRACT

## 2018-04-26 MED ORDER — GLUCOSE 4 GRAM CHEWABLE TAB
4 gram | ORAL | Status: DC | PRN
Start: 2018-04-26 — End: 2018-04-27

## 2018-04-26 MED ORDER — MAGNESIUM SULFATE 2 GRAM/50 ML IVPB
2 gram/50 mL (4 %) | INTRAVENOUS | Status: AC
Start: 2018-04-26 — End: 2018-04-26
  Administered 2018-04-26: 06:00:00 via INTRAVENOUS

## 2018-04-26 MED ORDER — INSULIN REGULAR HUMAN 100 UNIT/ML INJECTION
100 unit/mL | Freq: Once | INTRAMUSCULAR | Status: AC
Start: 2018-04-26 — End: 2018-04-26
  Administered 2018-04-26: 15:00:00 via INTRAVENOUS

## 2018-04-26 MED ORDER — SODIUM CHLORIDE 0.9 % IJ SYRG
INTRAMUSCULAR | Status: DC | PRN
Start: 2018-04-26 — End: 2018-04-27

## 2018-04-26 MED FILL — INSULIN LISPRO 100 UNIT/ML INJECTION: 100 unit/mL | SUBCUTANEOUS | Qty: 1

## 2018-04-26 MED FILL — SODIUM CHLORIDE 0.9 % IV: INTRAVENOUS | Qty: 1000

## 2018-04-26 MED FILL — POTASSIUM CHLORIDE SR 10 MEQ TAB: 10 mEq | ORAL | Qty: 4

## 2018-04-26 MED FILL — MONTELUKAST 10 MG TAB: 10 mg | ORAL | Qty: 1

## 2018-04-26 MED FILL — HEPARIN (PORCINE) 5,000 UNIT/ML IJ SOLN: 5000 unit/mL | INTRAMUSCULAR | Qty: 1

## 2018-04-26 MED FILL — NICOTINE 14 MG/24 HR DAILY PATCH: 14 mg/24 hr | TRANSDERMAL | Qty: 1

## 2018-04-26 MED FILL — IPRATROPIUM-ALBUTEROL 2.5 MG-0.5 MG/3 ML NEB SOLUTION: 2.5 mg-0.5 mg/3 ml | RESPIRATORY_TRACT | Qty: 3

## 2018-04-26 MED FILL — ISOVUE-370  76 % INTRAVENOUS SOLUTION: 370 mg iodine /mL (76 %) | INTRAVENOUS | Qty: 100

## 2018-04-26 MED FILL — ARNUITY ELLIPTA 100 MCG/ACTUATION POWDER FOR INHALATION: 100 mcg/actuation | RESPIRATORY_TRACT | Qty: 14

## 2018-04-26 MED FILL — INSULIN REGULAR HUMAN 100 UNIT/ML INJECTION: 100 unit/mL | INTRAMUSCULAR | Qty: 1

## 2018-04-26 MED FILL — SODIUM CHLORIDE 0.9 % IV: INTRAVENOUS | Qty: 500

## 2018-04-26 MED FILL — FLOVENT HFA 44 MCG/ACTUATION AEROSOL INHALER: 44 mcg/actuation | RESPIRATORY_TRACT | Qty: 10.6

## 2018-04-26 MED FILL — IPRATROPIUM-ALBUTEROL 2.5 MG-0.5 MG/3 ML NEB SOLUTION: 2.5 mg-0.5 mg/3 ml | RESPIRATORY_TRACT | Qty: 6

## 2018-04-26 MED FILL — MAGNESIUM SULFATE 2 GRAM/50 ML IVPB: 2 gram/50 mL (4 %) | INTRAVENOUS | Qty: 50

## 2018-04-26 MED FILL — SOLU-MEDROL (PF) 125 MG/2 ML SOLUTION FOR INJECTION: 125 mg/2 mL | INTRAMUSCULAR | Qty: 2

## 2018-04-26 MED FILL — INSULIN GLARGINE 100 UNIT/ML INJECTION: 100 unit/mL | SUBCUTANEOUS | Qty: 1

## 2018-04-26 MED FILL — PREDNISONE 20 MG TAB: 20 mg | ORAL | Qty: 1

## 2018-04-26 MED FILL — ALBUTEROL SULFATE 0.083 % (0.83 MG/ML) SOLN FOR INHALATION: 2.5 mg /3 mL (0.083 %) | RESPIRATORY_TRACT | Qty: 2

## 2018-04-26 MED FILL — MORPHINE 2 MG/ML INJECTION: 2 mg/mL | INTRAMUSCULAR | Qty: 2

## 2018-04-26 MED FILL — INSULIN LISPRO 100 UNIT/ML INJECTION: 100 unit/mL | SUBCUTANEOUS | Qty: 15

## 2018-04-26 MED FILL — MUCINEX 600 MG TABLET, EXTENDED RELEASE: 600 mg | ORAL | Qty: 1

## 2018-04-26 MED FILL — LORATADINE 10 MG TAB: 10 mg | ORAL | Qty: 1

## 2018-04-26 NOTE — Progress Notes (Signed)
Notified that Pt has decided to leave AMA.

## 2018-04-26 NOTE — ED Notes (Signed)
Patient has been instructed that they have been given morphine which contains opioids, benzodiazepines, or other sedating drugs. Patient is aware that they  will need to refrain from driving or operating heavy machinery after taking this medication.  Patient also instructed that they need to avoid drinking alcohol and using other products containing opioids, benzodiazepines, or other sedating drugs.  Patient verbalized understanding.      Pt drove here and does not have a ride. Conversed with MD and charge nurse. Pt having 10/10 chest pain sob. Pt also highly anxious and need to fo down for CT scan to r/o PE. Pt may be admitted or, at least , is far from discharge at this point.

## 2018-04-26 NOTE — Progress Notes (Signed)
[facility]: MED RECONCILIATION    Comments/Recommendations: Patient denies currently taking any prescription or OTC medications.    Medications removed:    Albuterol HFA  Albuterol Nebulizer  QVAR 40 mcg  Novolin 70/30   Lantus  Loratadine  Montelukast    Information obtained from: patient & previous medical records    Allergies: Bees [sting, bee] and Strawberry    Prior to Admission Medications:     None     Thank you,    Stephanie A Culbertson, PHARMD, BCPS  Contact: [phone]

## 2018-04-26 NOTE — ED Notes (Addendum)
Pt presents ambulatory to ED complaining of SOB, cough, lower back pain, and "my sugar is probably up" x 2 days. Pt reports being out of her insulin (lantus and Humalog) and metformin for over 2 months. Pt also reports not having and inhaler for her asthma. Pt is alert and oriented x 4, RR dyspnea at rest, skin is warm and dry. Assesment completed and pt updated on plan of care.       Emergency Department Nursing Plan of Care       The Nursing Plan of Care is developed from the Nursing assessment and Emergency Department Attending provider initial evaluation.  The plan of care may be reviewed in the ???ED Provider note???.    The Plan of Care was developed with the following considerations:   Patient / Family readiness to learn indicated by:verbalized understanding  Persons(s) to be included in education: patient  Barriers to Learning/Limitations:No    Signed     Megumi A Miyajima-Olguin, RN    04/26/2018   1:23 AM

## 2018-04-26 NOTE — H&P (Signed)
**Consult Information**  Member Facility: [place] [facility] : [facility]  Facility MRN: [id number]  Facility Time Zone: ET  Date and Time of Consult: 04/26/2018 05:40:18 AM  Requesting Clinician: Dr. Mertha Finders  Patient Name: Melinda Grant  Date of Birth: 01/02/1996  Gender: Female    **Problem/Plan**  SIRS: ? sec to hyperglycemic crisis.    - however, no nausea/emesis.     -  bilateral flank pain with normal urine.  ? musculoskeletal etiology.        - check blood cx. trend lactic acid   - IV hydration, BS control.    - replace electrolytes.  Hyperlgycemia: with normal AG  - non compliant with insulin regimen  - start lantus 20 units with humalog 6 units tid, SSI w/accuchecks  - hgb A1C, lipid panel.    - diabetic education.  Shortness of breath: - with clear phlegm  - ? asthma exacerbation  - bronchodilators, low dose steroids, mucinex.  Tobacco use: counselled on cessation.   - nicotine patch.  Morbid obesity: will benefit from weight loss, dietary and lifestyle modifications.    **General**  Code Status: Full Code  History of Present Illness: Pt is a 22 yr old female with tobacco use, asthma, IDDM who presents with multiple symptoms including dyspnea, bilateral flank pain, chest discomfort, shortness of breath ongoing for 2 days.       Has been experiencing bilateral flank pain for the past 2 days. No dysuria or hematuria, No fever/chills. No back pain. However, this morning, felt very short of breath with clear phlegm. Therefore, decided to come to ED.     Has not been compliant with insulin therapy - has been taking metformin only for the past 2 months.  No hospitalizations recently though.     Does smoke cigarettes regularly.     ED course - HR markedly elevated. BS in 500s. CT chest negative for PE. UA negative for UTI. Lactic acid - 2.5  Review of Systems: As per HPI, otherwise, rest of the ROS reviewed and negative.  Past Medical History: Asthma  DM II  Surgical History: Dental procedure   Social History: everyday smoker  No alcohol use    **Allergies and Medications**  Allergies: Bee sting  strawberries  Current Medications: Insulin  IV fluids  steroids  bronchodilator  Home Medications: ??? albuterol sulfate (PROVENTIL;VENTOLIN) 2.5 mg/0.5 mL Nebu 2.5 mg by Nebulization route as needed.      ??? albuterol (PROVENTIL, VENTOLIN) 90 mcg/Actuation inhaler Take 2 Puffs by inhalation every six (6) hours as needed.      ??? beclomethasone (QVAR) 40 mcg/Actuation inhaler Take 1 Puff by inhalation two (2) times a day.      ??? loratadine (CLARITIN) 10 mg tablet Take 10 mg by mouth daily.      ??? montelukast (SINGULAIR) 10 mg tablet Take 10 mg by mouth daily.      ??? insulin glargine (LANTUS) 100 unit/mL injection 50 Units by SubCUTAneous route once. Daily in AM       ??? insulin (NOVOLIN 70/30) 100 unit/mL (70-30) injection by SubCUTAneous route once. Sliding scale, with meals and at bedtime    **Vital Signs**  Temperature F: 98.4  Temperature C: 36.9  Blood Pressure (mmHg): 133/73  Heart Rate (bpm): 117  Vitals Entry Time: 04/26/2018 06:32:35 AM    **Exam**  Exam: Gen  - morbidly obese female. No acute distress.  HEENT  - NCAT, EOMI, sclera non icteric.  Neck - supple,  trachea midline  Pulm  - coarse upper airway breath sounds.  No wheezing noted.  CV - tachycardia, normal S1, S2. No m/r/g noted  Abd - + BS, soft, non tender, non distended.  Ext - non edematous  Skin - warm, dry  Neuro - CN II - XII grossly intact. Speech clear.  Psych - alert and oriented x 3. Cooperative with exam.    **Labs and Diagnostic Studies**  Labs: Significant for BS in 500s. K, Mg low  Lactic acid - 2.5  DiagnosticStudies: CT chest negative for PE  UA negative for UTI    **Quality Measures**  GI Prophylaxis Ordered: No  DVT Prophylaxis Ordered: Yes  Delirium: Not Present  Is there a problem with skin integrity?: No  Central Line: Not Present  Nutritional Consult Needed?: No    **Attestation**  Interaction Mode: Video & Phone   Phone Duration (mins): 6  Time of Call : 04/26/2018 05:51 AM  Video Duration (mins): 10  Time of Video : 04/26/2018 06:10 AM  Interaction Attestation: Clinical telemedicine services delivered using HIPAA-compliant interactive video-audio telecommunications while the patient and the rendering provider were not in the same physical location. Written report was provided to the requesting provider.  Evaluation Duration (mins): 35

## 2018-04-26 NOTE — ED Notes (Signed)
telehospitalist in room

## 2018-04-26 NOTE — ED Provider Notes (Signed)
EMERGENCY DEPARTMENT HISTORY AND PHYSICAL EXAM      Date: 04/25/2018  Patient Name: Melinda Grant    History of Presenting Illness     HPI: Melinda Grant is a 22 y.o. female with his past medical history of insulin-dependent diabetes, asthma, presents to the emergency room for bilateral flank pain, urinary urgency/frequency, chest tightness, cough, shortness of breath for 2 days.  Patient reports she has been using her inhaler without relief.  She says that she is noncompliant with all of her medications.  She has not checked her blood sugar recently.  She denies fever, nausea vomiting, peripheral edema, syncope, among other associated symptoms.    Pertinent social history: Current smoker    Pertinent surgical history: None    PCP: Susie Cassette, MD    Current Facility-Administered Medications   Medication Dose Route Frequency Provider Last Rate Last Dose   ??? LORazepam (ATIVAN) injection 1 mg  1 mg IntraVENous NOW Elvin So, MD       ??? magnesium sulfate 2 g/50 ml IVPB (premix or compounded)  2 g IntraVENous ONCE Munsif, Siddharth A, MD 50 mL/hr at 04/26/18 0638 2 g at 04/26/18 5366   ??? sodium chloride (NS) flush 5-40 mL  5-40 mL IntraVENous Q8H [place], [place], [place]   10 mL at 04/26/18 0531   ??? sodium chloride (NS) flush 5-40 mL  5-40 mL IntraVENous PRN Jacinta Shoe, PA         Current Outpatient Medications   Medication Sig Dispense Refill   ??? albuterol sulfate (PROVENTIL;VENTOLIN) 2.5 mg/0.5 mL Nebu 2.5 mg by Nebulization route as needed.     ??? albuterol (PROVENTIL, VENTOLIN) 90 mcg/Actuation inhaler Take 2 Puffs by inhalation every six (6) hours as needed.     ??? beclomethasone (QVAR) 40 mcg/Actuation inhaler Take 1 Puff by inhalation two (2) times a day.     ??? loratadine (CLARITIN) 10 mg tablet Take 10 mg by mouth daily.     ??? montelukast (SINGULAIR) 10 mg tablet Take 10 mg by mouth daily.     ??? insulin glargine (LANTUS) 100 unit/mL injection 50 Units by SubCUTAneous route once. Daily in AM       ??? insulin (NOVOLIN 70/30) 100 unit/mL (70-30) injection by SubCUTAneous route once. Sliding scale, with meals and at bedtime          Past History     Past Medical History:  Past Medical History:   Diagnosis Date   ??? Asthma    ??? Diabetes ([place])        Past Surgical History:  Past Surgical History:   Procedure Laterality Date   ??? HX HEENT      dental       Family History:  History reviewed. No pertinent family history.    Social History:  Social History     Tobacco Use   ??? Smoking status: Current Every Day Smoker     Packs/day: 0.25   ??? Smokeless tobacco: Never Used   Substance Use Topics   ??? Alcohol use: No   ??? Drug use: No       Allergies:  Allergies   Allergen Reactions   ??? Bees [Sting, Bee] Swelling   ??? Strawberry Itching     Allergic to "strawberry and banana mixed"         Review of Systems   Review of Systems   Constitutional: Positive for fatigue. Negative for chills and fever.   HENT: Positive for congestion. Negative for sore  throat.    Respiratory: Positive for cough, chest tightness, shortness of breath and wheezing.    Cardiovascular: Positive for chest pain. Negative for leg swelling.   Gastrointestinal: Negative for abdominal pain, nausea and vomiting.   Endocrine: Positive for polydipsia, polyphagia and polyuria.   Genitourinary: Positive for flank pain, frequency and urgency. Negative for pelvic pain, vaginal bleeding and vaginal discharge.        Denies pregnancy   Neurological: Positive for light-headedness. Negative for syncope, speech difficulty and headaches.       Physical Exam     Vitals:    04/26/18 0511 04/26/18 0542 04/26/18 0546 04/26/18 0632   BP:       Pulse: (!) 117 (!) 117     Resp: 26 21     Temp:    97.7 ??F (36.5 ??C)   SpO2: 94% 94% 94%    Weight:       Height:         Physical Exam   Constitutional: She is oriented to person, place, and time. She appears well-developed and well-nourished. No distress.   HENT:   Head: Normocephalic and atraumatic.   Right Ear: External ear normal.    Left Ear: External ear normal.   Mouth/Throat: No oropharyngeal exudate.   TM's normal bilaterally, dry mucous membranes   Neck: Normal range of motion. Neck supple.   Cardiovascular: Regular rhythm, normal heart sounds and intact distal pulses.   Tachycardic   Pulmonary/Chest: Breath sounds normal. She is in respiratory distress. She has no wheezes. She has no rales. She exhibits tenderness.   Abdominal: Soft. Bowel sounds are normal. She exhibits no distension and no mass. There is no tenderness. There is no rebound and no guarding.   No CVA tenderness   Musculoskeletal: She exhibits no edema.   No calf tenderness, calf swelling, pitting edema   Lymphadenopathy:     She has no cervical adenopathy.   Neurological: She is alert and oriented to person, place, and time.   Skin: Skin is warm and dry. No rash noted. She is not diaphoretic. No erythema. No pallor.   Psychiatric: She has a normal mood and affect. Her behavior is normal. Judgment and thought content normal.   Nursing note and vitals reviewed.        Diagnostic Study Results     Labs -     Recent Results (from the past 12 hour(s))   GLUCOSE, POC    Collection Time: 04/25/18 11:23 PM   Result Value Ref Range    Glucose (POC) 597 (H) 65 - 100 mg/dL    Performed by Georgeanna Lea    GLUCOSE, POC    Collection Time: 04/25/18 11:25 PM   Result Value Ref Range    Glucose (POC) 576 (H) 65 - 100 mg/dL    Performed by Georgeanna Lea    VENOUS BLOOD GAS    Collection Time: 04/25/18 11:45 PM   Result Value Ref Range    VENOUS PH 7.33 7.32 - 7.42      VENOUS PCO2 51 41 - 51 mmHg    VENOUS PO2 25 25 - 40 mmHg    VENOUS O2 SATURATION 40 (L) 65 - 88 %    VENOUS BICARBONATE 26 23 - 28 mmol/L    VENOUS BASE DEFICIT 0.6 mmol/L    O2 METHOD ROOM AIR      Sample source VENOUS      SITE OTHER     HCG URINE, QL. -  POC    Collection Time: 04/26/18 12:14 AM   Result Value Ref Range    Pregnancy test,urine (POC) NEGATIVE  NEG     MAGNESIUM    Collection Time: 04/26/18 12:16 AM    Result Value Ref Range    Magnesium 1.4 (L) 1.6 - 2.4 mg/dL   PHOSPHORUS    Collection Time: 04/26/18 12:16 AM   Result Value Ref Range    Phosphorus 2.6 2.6 - 4.7 MG/DL   CBC WITH AUTOMATED DIFF    Collection Time: 04/26/18 12:16 AM   Result Value Ref Range    WBC 10.2 3.6 - 11.0 K/uL    RBC 4.69 3.80 - 5.20 M/uL    HGB 12.4 11.5 - 16.0 g/dL    HCT 38.2 35.0 - 47.0 %    MCV 81.4 80.0 - 99.0 FL    MCH 26.4 26.0 - 34.0 PG    MCHC 32.5 30.0 - 36.5 g/dL    RDW 12.4 11.5 - 14.5 %    PLATELET 312 150 - 400 K/uL    MPV 10.5 8.9 - 12.9 FL    NRBC 0.0 0 PER 100 WBC    ABSOLUTE NRBC 0.00 0.00 - 0.01 K/uL    NEUTROPHILS 74 32 - 75 %    LYMPHOCYTES 21 12 - 49 %    MONOCYTES 4 (L) 5 - 13 %    EOSINOPHILS 1 0 - 7 %    BASOPHILS 0 0 - 1 %    IMMATURE GRANULOCYTES 0 0.0 - 0.5 %    ABS. NEUTROPHILS 7.5 1.8 - 8.0 K/UL    ABS. LYMPHOCYTES 2.2 0.8 - 3.5 K/UL    ABS. MONOCYTES 0.4 0.0 - 1.0 K/UL    ABS. EOSINOPHILS 0.1 0.0 - 0.4 K/UL    ABS. BASOPHILS 0.0 0.0 - 0.1 K/UL    ABS. IMM. GRANS. 0.0 0.00 - 0.04 K/UL    DF AUTOMATED     METABOLIC PANEL, COMPREHENSIVE    Collection Time: 04/26/18 12:16 AM   Result Value Ref Range    Sodium 135 (L) 136 - 145 mmol/L    Potassium 3.0 (L) 3.5 - 5.1 mmol/L    Chloride 99 97 - 108 mmol/L    CO2 27 21 - 32 mmol/L    Anion gap 9 5 - 15 mmol/L    Glucose 599 (HH) 65 - 100 mg/dL    BUN 7 6 - 20 MG/DL    Creatinine 0.88 0.55 - 1.02 MG/DL    BUN/Creatinine ratio 8 (L) 12 - 20      GFR est AA >60 >60 ml/min/1.31m    GFR est non-AA >60 >60 ml/min/1.743m   Calcium 8.9 8.5 - 10.1 MG/DL    Bilirubin, total 0.2 0.2 - 1.0 MG/DL    ALT (SGPT) 10 (L) 12 - 78 U/L    AST (SGOT) 4 (L) 15 - 37 U/L    Alk. phosphatase 96 45 - 117 U/L    Protein, total 6.6 6.4 - 8.2 g/dL    Albumin 3.1 (L) 3.5 - 5.0 g/dL    Globulin 3.5 2.0 - 4.0 g/dL    A-G Ratio 0.9 (L) 1.1 - 2.2     URINALYSIS W/ REFLEX CULTURE    Collection Time: 04/26/18 12:48 AM   Result Value Ref Range    Color YELLOW/STRAW      Appearance CLEAR CLEAR       Specific gravity 1.005 1.003 - 1.030  pH (UA) 6.0 5.0 - 8.0      Protein NEGATIVE  NEG mg/dL    Glucose >1,000 (A) NEG mg/dL    Ketone NEGATIVE  NEG mg/dL    Bilirubin NEGATIVE  NEG      Blood NEGATIVE  NEG      Urobilinogen 0.2 0.2 - 1.0 EU/dL    Nitrites NEGATIVE  NEG      Leukocyte Esterase SMALL (A) NEG      WBC 0-4 0 - 4 /hpf    RBC 0-5 0 - 5 /hpf    Epithelial cells FEW FEW /lpf    Bacteria NEGATIVE  NEG /hpf    UA:UC IF INDICATED CULTURE NOT INDICATED BY UA RESULT CNI     INFLUENZA A & B AG (RAPID TEST)    Collection Time: 04/26/18 12:48 AM   Result Value Ref Range    Influenza A Antigen NEGATIVE  NEG      Influenza B Antigen NEGATIVE  NEG     DRUG SCREEN, URINE    Collection Time: 04/26/18  1:43 AM   Result Value Ref Range    AMPHETAMINES NEGATIVE  NEG      BARBITURATES NEGATIVE  NEG      BENZODIAZEPINES NEGATIVE  NEG      COCAINE NEGATIVE  NEG      METHADONE NEGATIVE  NEG      OPIATES NEGATIVE  NEG      PCP(PHENCYCLIDINE) NEGATIVE  NEG      THC (TH-CANNABINOL) NEGATIVE  NEG      Drug screen comment (NOTE)    SALICYLATE    Collection Time: 04/26/18  3:26 AM   Result Value Ref Range    Salicylate level <9.5 (L) 2.8 - 20.0 MG/DL   GLUCOSE, POC    Collection Time: 04/26/18  3:28 AM   Result Value Ref Range    Glucose (POC) 344 (H) 65 - 100 mg/dL    Performed by Georgeanna Lea    POC TROPONIN-I    Collection Time: 04/26/18  3:31 AM   Result Value Ref Range    Troponin-I (POC) <0.04 0.00 - 0.08 ng/mL   LACTIC ACID    Collection Time: 04/26/18  4:55 AM   Result Value Ref Range    Lactic acid 2.4 (HH) 0.4 - 2.0 MMOL/L       Radiologic Studies -   CTA CHEST W OR W WO CONT   Final Result   IMPRESSION:    There is no pulmonary embolism.   There is no aortic aneurysm or dissection.   No acute intrathoracic process          XR CHEST PORT   Final Result   IMPRESSION:   No acute process.                     CT Results  (Last 48 hours)               04/26/18 0257  CTA CHEST W OR W WO CONT Final result     Impression:  IMPRESSION:    There is no pulmonary embolism.   There is no aortic aneurysm or dissection.   No acute intrathoracic process            Narrative:  CLINICAL HISTORY: Chest pain       INDICATION: Chest pain       COMPARISON: 11/14/2008       TECHNIQUE: CT of the chest with  IV contrast , Isovue-370 is performed. Axial   images from the thoracic inlet to the level of the upper abdomen are obtained.   Manual post-processing of the images and coronal reformatting is also performed.   CT dose reduction was achieved through use of a standardized protocol tailored   for this examination and automatic exposure control for dose modulation.   Multiplanar reformatted imaging was performed.   Sagittal and coronal reformatting.    3-D Postprocessing of imaging was performed.    3-D MIP reconstructed images were obtained in the coronal plane.       FINDINGS:    There is no pulmonary embolism.   There is no aortic aneurysm or dissection.       Hepatic steatosis. There is no pleural or pericardial effusion. There is no   mediastinal, axillary or hilar lymphadenopathy. The aorta is normal in course   and caliber. The proximal pulmonary arteries are without evidence of filling   defects. No lytic or blastic lesions are identified. The remainder of the upper   abdomen visualized is unremarkable.               CXR Results  (Last 48 hours)               04/26/18 0003  XR CHEST PORT Final result    Impression:  IMPRESSION:   No acute process.                        Narrative:  PORTABLE CHEST RADIOGRAPH/S: 04/26/2018 12:03 AM       Clinical history: DKA and tachycardia   INDICATION:   dka, tachycardia   COMPARISON: 2010       FINDINGS:   AP portable upright view of the chest demonstrates a stable  cardiopericardial   silhouette. The lungs are adequately expanded.  There is no edema, effusion,   consolidation, or pneumothorax. The osseous structures are unremarkable. Patient   is on a cardiac monitor.                       EKG:  Interpreted by Dr. Jamal Collin  Time Interpreted: 0100  Rate: 134.Sinus tach. S1 Q3and ?biphasic T wave in 3. No obvious st elevation or depression. No ectopy.     ED Course and Progress notes:   Initial assessment performed. The patients presenting problems have been discussed, and they are in agreement with the care plan formulated and outlined with them.  I have encouraged them to ask questions as they arise throughout their visit.    ED Course as of Apr 26 645   Thu Apr 26, 2018   0208 99%RA. Breath sounds improved. Still c/o chest pain. Analgesia ordered.    [SS]   [id number] Negative work-up.  Still tachycardic.  States she is breathing better. Unclear why she has this degree of tachycardia-- possibly substance abuse/withdrawal vs dehydration vs med effect from albuterol.  Per [facility] patient has been seen at a hospital in [place] 7 times since she was last seen here.  PMP search under her name did not show any prescriptions for controlled substances.    [SS]   0451 Heart rate down to 115 now.     [SS]   5409 Persistently tachycardic. Went to reassess patient. While she stated her breathing had improved, on auscultation she still has persistent wheeze. Review of old records shows the last time she had this degree of tachycardia she  was having an asthma exac.  Will likely admit for asthma exac.    [SS]   0981 Accepted by Dr. Vivi Barrack for admission.    [SS]      ED Course User Index  [SS] Elvin So, MD       Vital Signs-Reviewed the patient's vital signs.  Vitals:    04/26/18 0511 04/26/18 0542 04/26/18 0546 04/26/18 0632   BP:       Pulse: (!) 117 (!) 117     Resp: 26 21     Temp:    97.7 ??F (36.5 ??C)   SpO2: 94% 94% 94%    Weight:       Height:           Medications Administered During ED Course  Medications   sodium chloride (NS) flush 5-40 mL (10 mL IntraVENous Given 04/26/18 0531)   sodium chloride (NS) flush 5-40 mL (has no administration in time range)    LORazepam (ATIVAN) injection 1 mg (has no administration in time range)   magnesium sulfate 2 g/50 ml IVPB (premix or compounded) (2 g IntraVENous New Bag 04/26/18 0638)   sodium chloride 0.9 % bolus infusion 1,000 mL (0 mL IntraVENous IV Completed 04/26/18 0246)   sodium chloride 0.9 % bolus infusion 1,000 mL (0 mL IntraVENous IV Completed 04/26/18 0146)   albuterol-ipratropium (DUO-NEB) 2.5 MG-0.5 MG/3 ML (3 mL Nebulization Given 04/26/18 0042)   insulin regular (NOVOLIN R, HUMULIN R) injection 10 Units (10 Units IntraVENous Given 04/26/18 0135)   potassium chloride SR (KLOR-CON 10) tablet 40 mEq (40 mEq Oral Given 04/26/18 0136)   magnesium sulfate 2 g/50 ml IVPB (premix or compounded) (0 g IntraVENous IV Completed 04/26/18 0238)   iopamidol (ISOVUE-370) 76 % injection 100 mL (100 mL IntraVENous Given 04/26/18 0158)   morphine injection 4 mg (4 mg IntraVENous Given 04/26/18 0232)   albuterol-ipratropium (DUO-NEB) 2.5 MG-0.5 MG/3 ML (6 mL Nebulization Given 04/26/18 0339)   sodium chloride 0.9 % bolus infusion 1,000 mL (0 mL IntraVENous IV Completed 04/26/18 0431)   methylPREDNISolone (PF) (Solu-MEDROL) injection 125 mg (125 mg IntraVENous Given 04/26/18 0531)   albuterol (PROVENTIL VENTOLIN) nebulizer solution 5 mg (5 mg Nebulization Given 04/26/18 0547)   potassium chloride SR (KLOR-CON 10) tablet 40 mEq (40 mEq Oral Given 04/26/18 0623)     1:09 AM  Pt care assumed by Dr. Jamal Collin, at this time. He has been updated on pt's hx, sxs, vitals, exam, labs pending/ resulted, and imaging ordered. Provider is in agreement with care plan at this time.     CRITICAL CARE NOTE :    6:30 AM      IMPENDING DETERIORATION -Respiratory, Cardiovascular, CNS and Metabolic    ASSOCIATED RISK FACTORS - Hypotension, Shock, Hypoxia, Dysrhythmia, Metabolic changes, Vascular Compromise and CNS Decompensation    MANAGEMENT- Bedside Assessment and Supervision of Care    INTERPRETATION -  Xrays, CT Scan, ECG, Blood Pressure and Cardiac Output Measures      INTERVENTIONS - hemodynamic mngmt, transcutaneous pacing, vascular control, Neurologic interventions  and Metobolic interventions    CASE REVIEW - Hospitalist and Nursing    TREATMENT RESPONSE -Improved    PERFORMED BY - Self        NOTES   :      I have spent 78 minutes of critical care time involved in lab review, consultations with specialist, family decision- making, bedside attention and documentation. During this entire length of time I was immediately available to the  patient .    Elvin So, MD        Disposition:  admit    Follow-up Information    None         Current Discharge Medication List          Provider Notes (Medical Decision Making):   Differential diagnosis: DKA, dehydration, influenza, asthma exacerbation, pneumonia, bronchitis, UTI, pyelonephritis      Diagnosis     Clinical Impression:   1. Moderate persistent asthma with acute exacerbation    2. Hypomagnesemia    3. Hyperglycemia    4. Tachycardia        Please note that this dictation was completed with Dragon, the computer voice recognition software.  Quite often unanticipated grammatical, syntax, homophones, and other interpretive errors are inadvertently transcribed by the computer software.  Please disregard these errors.  Please excuse any errors that have escaped final proofreading.    This note will not be viewable in [place].

## 2018-04-26 NOTE — ED Notes (Signed)
Bedside and Verbal shift change report given to Katie RN (oncoming nurse) by Megumi RN (offgoing nurse). Report included the following information SBAR, Kardex, ED Summary, Intake/Output, MAR, Recent Results and Med Rec Status.

## 2018-04-26 NOTE — Progress Notes (Signed)
Reason for Admission:   Per  Dr. Jamal Collin ED Admit Note: "22 y.o. female with his past medical history of insulin-dependent diabetes, asthma, presents to the emergency room for bilateral flank pain, urinary urgency/frequency, chest tightness, cough, shortness of breath for 2 days."                   RRAT Score:  4                   Plan for utilizing home health:  TBD - CM will assist medical team to arrange for home health if ordered.                        Current Advanced Directive/Advance Care Plan: Pt is a Full Code Status. No advanced directive documents on chart. Medical records identifies the      [place] as pt's mother,  Tine-Alexander, Rosalee Kaufman [phone]).                Transition of Care Plan:  CM met with pt. She reported she had been experiencing cough x 1 day and pain in side x 2 days. Pt confirmed her home address and contact phone number as documented in chart. She lives in an apartment with her adult cousin and cousin's 5 children (49yi0, 2yo twins, and 1yo twins). 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 Pt reported she has a car and drove herself to the ED. She has no medical insurance coverage at this time. Pt reported she had [employer] that became inactive as of 08/22/17. CM reported a MedAssist referral would be made while in the hospital to assist with reapplying for Medicaid. Pt reported she last saw a PCP through [facility]). She was unable to recall when her last appointment was with the PCP. CM agreed to contact [facility] to arrange for a post-discharge f/u appointment. [facility] reported pt last saw Elgie Collard, NP on 05/12/2016. A f/u appointment was arranged with Ms. Staples on Thursday, Sept. 26 at 1430. [facility] requested for CM to fax over the medical records when pt is d/c (fax # [phone]). CM reported a medication voucher may be offered for d/c  prescriptions as approved by [employer]. Pt is to be admitted to the medical unit today and CM will f/u with pt and medical team to assist with d/c planning.                     Care Management Interventions  PCP Verified by CM: Yes(Juliette Staples, NP [facility]))  Last Visit to PCP: 05/12/16  Mode of Transport at Discharge: Self(pt has a car and drives herself)  Transition of Care Consult (CM Consult): Discharge Planning  Current Support Network: (pt lives with adult cousin and cousin's 5 children)  Confirm Follow Up Transport: Self  Plan discussed with Pt/Family/Caregiver: Yes  Discharge Location  Discharge Placement: Home

## 2018-04-26 NOTE — H&P (Addendum)
Hospitalist Admission Note    NAME: Melinda Grant   DOB:  Nov 24, 1995   MRN:  [id number]   Room Number: 202/01  @ [facility]     Date/Time:  04/26/2018 6:10 PM    Patient PCP: Susie Cassette, MD  ______________________________________________________________________  Given the patient's current clinical presentation, I have a high level of concern for decompensation if discharged from the emergency department.  Complex decision making was performed, which includes reviewing the patient's available past medical records, laboratory results, and x-ray films.       My assessment of this patient's clinical condition and my plan of care is as follows.    Assessment / Plan:       Active Problems:    SIRS (systemic inflammatory response syndrome) (HCC) (04/26/2018)      Hyperglycemia in the setting of type 2 diabetes :   Lab Results   Component Value Date/Time    Hemoglobin A1c 13.9 (H) 04/26/2018 07:33 AM   patient does not take any insulin at home as she stopped following up with PCP in [place] after she moved to [place] 2 months ago. FSG is downtrending with subcutaneous insulin. Urine ketones negative.     - Increase lantus to 20 units BID.   - 12 units lispro TID AC with correctional scale normal sensitivity.   - Diabetes consult  - RD consult  - Consistent carb diet  - Volume repletion  - Hypoglycemia protocol.       Lactic acidosis :   Likely as a result of volume depletion due to osmotic diuresis and decreased oral intake. Downtrending.     - Has received 3.5 L fluid bolus. Currently getting maintenance infusion at 150cc/hr NS.       Asthma exacerbation :   Viral LRTI   Likely triggered by viral illness. flu rapid testing negative. CXR does not show acute process.     - fluticasone inhaler, montelukast, albuterol-ipratropium prn and sch. Will transition to laba-ics combination with saba at discharge.   - guaifenesin sCh. Tessalon PRN for cough.   -         Obesity :      Body mass index is 44.63 kg/m??.     - RD consult.     Nicotine use disorder:     - Declines nicotine patch.   - Patient was counseled extensively on the need to abstain from tobacco, its addictive tendencies, its deleterious effects on the lungs as well as its financial sequelae    Code Status: FULL   Surrogate Decision Maker: cousin    DVT Prophylaxis: heparin, scd  GI Prophylaxis: not indicated    Baseline: independent         Subjective:   CHIEF COMPLAINT:  shrotness of breath, polyuria, polydipsia     HISTORY OF PRESENT ILLNESS:     Melinda Grant is a 22 y.o.  African American female with PMH of type 2 diabetes, asthma, obesity who presents to ED with c/o shortness of breath polyuria, polydipsia.     She was living in [place] until 2 months ago when she moved to [place] to live with her cousin. She used to take Glargine 45-50 units twice daily and humalog but has not used insulin in 2 months due to lapse in follow up with PCP. She does not know if she has type 1 or type 2 diabetes. She is not compliant with consistent carbohydrate diet. She has been experiencing polyuria, polydipsia for the last week  with decrease in appetite. She has also had shortness of breath, non productive cough, chest tightness, wheezing, nasal congestion for the same period. Denies fevers, chills, recent travel, sick contacts. Has not taken flu shot this season.         Past Medical History:   Diagnosis Date   ??? Asthma    ??? Diabetes ([place])         Past Surgical History:   Procedure Laterality Date   ??? HX HEENT      dental       Social History     Tobacco Use   ??? Smoking status: Current Every Day Smoker     Packs/day: 0.25   ??? Smokeless tobacco: Never Used   Substance Use Topics   ??? Alcohol use: No        History reviewed. No pertinent family history.  Allergies   Allergen Reactions   ??? Bees [Sting, Bee] Swelling   ??? Strawberry Itching     Allergic to "strawberry and banana mixed"        Prior to Admission medications    Not on File        REVIEW OF SYSTEMS:     Total of 12 systems reviewed as follows:         General:  Negative for fever, chills, sweats, weight loss/gain,        Eyes:    Negative for blurred vision, eye pain, loss of vision, double vision  ENT:    rhinorrhea, pharyngitis   Respiratory:   Negative for sputum production  Cardiology:   Negative for chest pain, palpitations, orthopnea, PND, edema, syncope   Gastrointestinal:  Negative for abdominal pain, N/V, diarrhea, dysphagia, constipation, bleeding   Genitourinary:    + for Polyuria, polydipsia. Negative for frequency, urgency, dysuria, hematuria, incontinence   Muskuloskeletal :  Negative for arthralgia, myalgia, back pain  Hematology:  Negative for easy bruising, nose or gum bleeding, lymphadenopathy   Dermatological: Negative for rash, ulceration, pruritis, color change / jaundice  Endocrine:   Negative for hot flashes or polydipsia   Neurological:  Negative for headache, dizziness, confusion, focal weakness, paresthesia,     Speech difficulties, memory loss, gait difficulty  Psychological: Negative for Feelings of anxiety, depression, agitation    Objective:   VITALS:    Visit Vitals  BP 140/79 (BP 1 Location: Left arm, BP Patient Position: At rest)   Pulse (!) 108   Temp 98.2 ??F (36.8 ??C)   Resp 20   Ht '5\' 4"'$  (1.626 m)   Wt 117.9 kg (260 lb)   SpO2 95%   BMI 44.63 kg/m??       PHYSICAL EXAM:    General:    Obese, Alert, cooperative, no distress, appears stated age.     HEENT: Atraumatic, anicteric sclerae, pink conjunctivae     No oral ulcers, mucosa moist, throat clear, dentition fair  Neck:  Supple, symmetrical,  no JVD, thyroid: non tender  Lungs:   Decreased air entry. No Rhonchi. No rales.  Chest wall:  No tenderness  No Accessory muscle use.  Heart:   Regular  rhythm,  No  murmur   No edema  Abdomen:   Soft, non-tender. Not distended.  Bowel sounds normal  Extremities: No cyanosis.  No clubbing,      Skin turgor normal, Capillary refill normal, Radial dial pulse 2+   Skin:     Not pale.  Not Jaundiced  No rashes   Psych:  Good insight.  Not depressed.  Not anxious or agitated.  Neurologic: EOMs intact. No facial asymmetry. No aphasia or slurred speech. Symmetrical strength, Sensation grossly intact. Alert and oriented X 4.     ______________________________________________________________________    Care Plan discussed with:  Patient/Family, Nurse and Case Manager    Expected  Disposition:  Home w/Family  ________________________________________________________________________  TOTAL TIME:  40 Minutes    Critical Care Provided     Minutes non procedure based      Comments     Reviewed previous records   >50% of visit spent in counseling and coordination of care  Discussion with patient and/or family and questions answered       ________________________________________________________________________  Signed: Elroy Channel, MD    Procedures: see electronic medical records for all procedures/Xrays and details which were not copied into this note but were reviewed prior to creation of Plan.    LAB DATA REVIEWED:    Recent Results (from the past 24 hour(s))   GLUCOSE, POC    Collection Time: 04/25/18 11:23 PM   Result Value Ref Range    Glucose (POC) 597 (H) 65 - 100 mg/dL    Performed by Georgeanna Lea    GLUCOSE, POC    Collection Time: 04/25/18 11:25 PM   Result Value Ref Range    Glucose (POC) 576 (H) 65 - 100 mg/dL    Performed by Georgeanna Lea    VENOUS BLOOD GAS    Collection Time: 04/25/18 11:45 PM   Result Value Ref Range    VENOUS PH 7.33 7.32 - 7.42      VENOUS PCO2 51 41 - 51 mmHg    VENOUS PO2 25 25 - 40 mmHg    VENOUS O2 SATURATION 40 (L) 65 - 88 %    VENOUS BICARBONATE 26 23 - 28 mmol/L    VENOUS BASE DEFICIT 0.6 mmol/L    O2 METHOD ROOM AIR      Sample source VENOUS      SITE OTHER     EKG, 12 LEAD, INITIAL    Collection Time: 04/25/18 11:58 PM   Result Value Ref Range    Ventricular Rate 134 BPM    Atrial Rate 134 BPM    P-R Interval 146 ms     QRS Duration 76 ms    Q-T Interval 290 ms    QTC Calculation (Bezet) 433 ms    Calculated P Axis 71 degrees    Calculated R Axis 63 degrees    Calculated T Axis 54 degrees    Diagnosis       Sinus tachycardia  RSR' or QR pattern in V1 suggests right ventricular conduction delay  Low voltage QRS  No previous ECGs available  Confirmed by Baird Cancer [phone]) on 04/26/2018 12:00:57 PM     HCG URINE, QL. - POC    Collection Time: 04/26/18 12:14 AM   Result Value Ref Range    Pregnancy test,urine (POC) NEGATIVE  NEG     MAGNESIUM    Collection Time: 04/26/18 12:16 AM   Result Value Ref Range    Magnesium 1.4 (L) 1.6 - 2.4 mg/dL   PHOSPHORUS    Collection Time: 04/26/18 12:16 AM   Result Value Ref Range    Phosphorus 2.6 2.6 - 4.7 MG/DL   CBC WITH AUTOMATED DIFF    Collection Time: 04/26/18 12:16 AM   Result Value Ref Range    WBC 10.2 3.6 - 11.0 K/uL    RBC  4.69 3.80 - 5.20 M/uL    HGB 12.4 11.5 - 16.0 g/dL    HCT 38.2 35.0 - 47.0 %    MCV 81.4 80.0 - 99.0 FL    MCH 26.4 26.0 - 34.0 PG    MCHC 32.5 30.0 - 36.5 g/dL    RDW 12.4 11.5 - 14.5 %    PLATELET 312 150 - 400 K/uL    MPV 10.5 8.9 - 12.9 FL    NRBC 0.0 0 PER 100 WBC    ABSOLUTE NRBC 0.00 0.00 - 0.01 K/uL    NEUTROPHILS 74 32 - 75 %    LYMPHOCYTES 21 12 - 49 %    MONOCYTES 4 (L) 5 - 13 %    EOSINOPHILS 1 0 - 7 %    BASOPHILS 0 0 - 1 %    IMMATURE GRANULOCYTES 0 0.0 - 0.5 %    ABS. NEUTROPHILS 7.5 1.8 - 8.0 K/UL    ABS. LYMPHOCYTES 2.2 0.8 - 3.5 K/UL    ABS. MONOCYTES 0.4 0.0 - 1.0 K/UL    ABS. EOSINOPHILS 0.1 0.0 - 0.4 K/UL    ABS. BASOPHILS 0.0 0.0 - 0.1 K/UL    ABS. IMM. GRANS. 0.0 0.00 - 0.04 K/UL    DF AUTOMATED     METABOLIC PANEL, COMPREHENSIVE    Collection Time: 04/26/18 12:16 AM   Result Value Ref Range    Sodium 135 (L) 136 - 145 mmol/L    Potassium 3.0 (L) 3.5 - 5.1 mmol/L    Chloride 99 97 - 108 mmol/L    CO2 27 21 - 32 mmol/L    Anion gap 9 5 - 15 mmol/L    Glucose 599 (HH) 65 - 100 mg/dL    BUN 7 6 - 20 MG/DL     Creatinine 0.88 0.55 - 1.02 MG/DL    BUN/Creatinine ratio 8 (L) 12 - 20      GFR est AA >60 >60 ml/min/1.75m    GFR est non-AA >60 >60 ml/min/1.753m   Calcium 8.9 8.5 - 10.1 MG/DL    Bilirubin, total 0.2 0.2 - 1.0 MG/DL    ALT (SGPT) 10 (L) 12 - 78 U/L    AST (SGOT) 4 (L) 15 - 37 U/L    Alk. phosphatase 96 45 - 117 U/L    Protein, total 6.6 6.4 - 8.2 g/dL    Albumin 3.1 (L) 3.5 - 5.0 g/dL    Globulin 3.5 2.0 - 4.0 g/dL    A-G Ratio 0.9 (L) 1.1 - 2.2     URINALYSIS W/ REFLEX CULTURE    Collection Time: 04/26/18 12:48 AM   Result Value Ref Range    Color YELLOW/STRAW      Appearance CLEAR CLEAR      Specific gravity 1.005 1.003 - 1.030      pH (UA) 6.0 5.0 - 8.0      Protein NEGATIVE  NEG mg/dL    Glucose >1,000 (A) NEG mg/dL    Ketone NEGATIVE  NEG mg/dL    Bilirubin NEGATIVE  NEG      Blood NEGATIVE  NEG      Urobilinogen 0.2 0.2 - 1.0 EU/dL    Nitrites NEGATIVE  NEG      Leukocyte Esterase SMALL (A) NEG      WBC 0-4 0 - 4 /hpf    RBC 0-5 0 - 5 /hpf    Epithelial cells FEW FEW /lpf    Bacteria NEGATIVE  NEG /hpf  UA:UC IF INDICATED CULTURE NOT INDICATED BY UA RESULT CNI     INFLUENZA A & B AG (RAPID TEST)    Collection Time: 04/26/18 12:48 AM   Result Value Ref Range    Influenza A Antigen NEGATIVE  NEG      Influenza B Antigen NEGATIVE  NEG     DRUG SCREEN, URINE    Collection Time: 04/26/18  1:43 AM   Result Value Ref Range    AMPHETAMINES NEGATIVE  NEG      BARBITURATES NEGATIVE  NEG      BENZODIAZEPINES NEGATIVE  NEG      COCAINE NEGATIVE  NEG      METHADONE NEGATIVE  NEG      OPIATES NEGATIVE  NEG      PCP(PHENCYCLIDINE) NEGATIVE  NEG      THC (TH-CANNABINOL) NEGATIVE  NEG      Drug screen comment (NOTE)    SALICYLATE    Collection Time: 04/26/18  3:26 AM   Result Value Ref Range    Salicylate level <1.7 (L) 2.8 - 20.0 MG/DL   GLUCOSE, POC    Collection Time: 04/26/18  3:28 AM   Result Value Ref Range    Glucose (POC) 344 (H) 65 - 100 mg/dL    Performed by Georgeanna Lea    POC TROPONIN-I     Collection Time: 04/26/18  3:31 AM   Result Value Ref Range    Troponin-I (POC) <0.04 0.00 - 0.08 ng/mL   LACTIC ACID    Collection Time: 04/26/18  4:55 AM   Result Value Ref Range    Lactic acid 2.4 (HH) 0.4 - 2.0 MMOL/L   LACTIC ACID    Collection Time: 04/26/18  6:25 AM   Result Value Ref Range    Lactic acid 2.6 (HH) 0.4 - 2.0 MMOL/L   CULTURE, BLOOD    Collection Time: 04/26/18  6:25 AM   Result Value Ref Range    Special Requests: NO SPECIAL REQUESTS      Culture result: NO GROWTH AFTER 4 HOURS     CULTURE, BLOOD    Collection Time: 04/26/18  6:45 AM   Result Value Ref Range    Special Requests: NO SPECIAL REQUESTS      Culture result: NO GROWTH AFTER 4 HOURS     EKG, 12 LEAD, SUBSEQUENT    Collection Time: 04/26/18  7:32 AM   Result Value Ref Range    Ventricular Rate 112 BPM    Atrial Rate 112 BPM    P-R Interval 160 ms    QRS Duration 78 ms    Q-T Interval 336 ms    QTC Calculation (Bezet) 458 ms    Calculated P Axis 72 degrees    Calculated R Axis 74 degrees    Calculated T Axis 59 degrees    Diagnosis       Sinus tachycardia  RSR' or QR pattern in V1 suggests right ventricular conduction delay  Otherwise normal ECG    Confirmed by Baird Cancer [phone]) on 04/26/2018 12:00:31 PM     LIPID PANEL    Collection Time: 04/26/18  7:33 AM   Result Value Ref Range    LIPID PROFILE          Cholesterol, total 138 <200 MG/DL    Triglyceride 52 <150 MG/DL    HDL Cholesterol 38 MG/DL    LDL, calculated 89.6 0 - 100 MG/DL    VLDL, calculated 10.4 MG/DL    CHOL/HDL  Ratio 3.6 0.0 - 5.0     HEMOGLOBIN A1C WITH EAG    Collection Time: 04/26/18  7:33 AM   Result Value Ref Range    Hemoglobin A1c 13.9 (H) 4.2 - 6.3 %    Est. average glucose 160 mg/dL   METABOLIC PANEL, BASIC    Collection Time: 04/26/18  7:33 AM   Result Value Ref Range    Sodium 135 (L) 136 - 145 mmol/L    Potassium 3.9 3.5 - 5.1 mmol/L    Chloride 102 97 - 108 mmol/L    CO2 22 21 - 32 mmol/L    Anion gap 11 5 - 15 mmol/L     Glucose 419 (H) 65 - 100 mg/dL    BUN 6 6 - 20 MG/DL    Creatinine 0.65 0.55 - 1.02 MG/DL    BUN/Creatinine ratio 9 (L) 12 - 20      GFR est AA >60 >60 ml/min/1.89m    GFR est non-AA >60 >60 ml/min/1.764m   Calcium 8.6 8.5 - 10.1 MG/DL   MAGNESIUM    Collection Time: 04/26/18  7:33 AM   Result Value Ref Range    Magnesium 3.0 (H) 1.6 - 2.4 mg/dL   PHOSPHORUS    Collection Time: 04/26/18  7:33 AM   Result Value Ref Range    Phosphorus 3.5 2.6 - 4.7 MG/DL   GLUCOSE, POC    Collection Time: 04/26/18  7:36 AM   Result Value Ref Range    Glucose (POC) 400 (H) 65 - 100 mg/dL    Performed by ViLenora BoysEDT)    POC TROPONIN-I    Collection Time: 04/26/18  9:05 AM   Result Value Ref Range    Troponin-I (POC) <0.04 0.00 - 0.08 ng/mL   GLUCOSE, POC    Collection Time: 04/26/18  9:06 AM   Result Value Ref Range    Glucose (POC) 528 (H) 65 - 100 mg/dL    Performed by ViLenora BoysEDT)    GLUCOSE, POC    Collection Time: 04/26/18  9:11 AM   Result Value Ref Range    Glucose (POC) 469 (H) 65 - 100 mg/dL    Performed by ViLenora BoysEDT)    GLUCOSE, POC    Collection Time: 04/26/18 10:51 AM   Result Value Ref Range    Glucose (POC) 424 (H) 65 - 100 mg/dL    Performed by HeMaylon [employer])    LACTIC ACID    Collection Time: 04/26/18 11:16 AM   Result Value Ref Range    Lactic acid 2.1 (HH) 0.4 - 2.0 MMOL/L   GLUCOSE, POC    Collection Time: 04/26/18  4:54 PM   Result Value Ref Range    Glucose (POC) 343 (H) 65 - 100 mg/dL    Performed by GoKate SablePCT)

## 2018-04-26 NOTE — ED Notes (Signed)
Patient resting in bed and denies needing anything at this time.

## 2018-04-26 NOTE — ED Notes (Signed)
Tried to call report upstairs. No nurse available.

## 2018-04-26 NOTE — Discharge Summary (Addendum)
Hospitalist Discharge Summary     Patient ID:  Daelynn Blower  [id number]  22 y.o.  07/10/1996    PCP on record: Consuella Lose, MD    Admit date: 04/25/2018  Discharge date and time: 04/27/2018      Admission Diagnoses: SIRS (systemic inflammatory response syndrome) (HCC) [R65.10]    Discharge Diagnoses:    Active Problems:    SIRS (systemic inflammatory response syndrome) (HCC) (04/26/2018)           Hospital Course:   Patient left against medical advice before resolution of acute illness overnight stating that she needed to return her cousin's car and would return to the ED if needed. She was fully informed of risks of leaving AMA including death.         Hyperglycemia in the setting of type 2 diabetes, A1C 13.9 %:         Lab Results   Component Value Date/Time   ?? Hemoglobin A1c 13.9 (H) 04/26/2018 07:33 AM   patient does not take any insulin at home as she stopped following up with PCP in [place] after she moved to [place] 2 months ago. Urine ketones negative. She was administered long and short acting insulin with downtrending fingersticks and provided volume repletion.   Last FSG prior to discharge was 340.   ??  ??  Lactic acidosis :   Likely as a result of volume depletion due to osmotic diuresis and decreased oral intake. Received fluid bolus NS and maintenance infusion.   ??  ??  Asthma exacerbation :   Viral LRTI   Likely triggered by viral illness. flu rapid testing negative. CXR does not show acute process.   fluticasone inhaler, montelukast, albuterol-ipratropium prn and sch.   ??  ??  ??  Obesity :   Body mass index is 44.63 kg/m??.   RD consult was placed.   ??  Nicotine use disorder:  - Declines nicotine patch.   - Patient was counseled extensively on admission on the need to abstain from tobacco, its addictive tendencies, its deleterious effects on the lungs as well as its financial sequelae  ??          CONSULTATIONS:  None    Excerpted HPI from H&P of Siddharth A Munsif, MD:   22 y.o.  African American female with PMH of type 2 diabetes, asthma, obesity who presents to ED with c/o shortness of breath polyuria, polydipsia.   ??  She was living in [place] until 2 months ago when she moved to [place] to live with her cousin. She used to take Glargine 45-50 units twice daily and humalog but has not used insulin in 2 months due to lapse in follow up with PCP. She does not know if she has type 1 or type 2 diabetes. She is not compliant with consistent carbohydrate diet. She has been experiencing polyuria, polydipsia for the last week with decrease in appetite. She has also had shortness of breath, non productive cough, chest tightness, wheezing, nasal congestion for the same period. Denies fevers, chills, recent travel, sick contacts. Has not taken flu shot this season.   ??    ______________________________________________________________________  DISCHARGE SUMMARY/HOSPITAL COURSE:  for full details see H&P, daily progress notes, labs, consult notes.             _______________________________________________________________________  Patient left AMA overnight and not examined by this writer on discharge day.   _______________________________________________________________________  DISCHARGE MEDICATIONS:   Discharge Medication List as of 04/27/2018  6:43 AM          My Recommended Diet, Activity, Wound Care, and follow-up labs are listed in the patient's Discharge Insturctions which I have personally completed and reviewed.    _______________________________________________________________________  DISPOSITION:  Against medical advice                            Condition at Discharge:  Stable  _______________________________________________________________________  Follow up with:   PCP : Consuella LoseKing, Carla, MD  Follow-up Information     Follow up With Specialties Details Why Contact Info    Anitra LauthJulette Staples, NP  On 05/03/2018 You have an appointment with your PCP,  Alger MemosJuliette Staples, NP on Thursday. Sept. 26 at 2:30pm/ [facility]  [facility]  [address]  [place], [place] [id number]  [phone]              Total time in minutes spent coordinating this discharge (includes going over instructions, follow-up, prescriptions, and preparing report for sign off to her PCP) :  10 minutes    Signed:  Lovina ReachNitisha Chakara Bognar, MD

## 2018-04-26 NOTE — Progress Notes (Addendum)
Patient decided to leave AMA. Pt educated on necessity of treatment and risks associated with discharging prematurely. Patient adamant about leaving, stating she has to return her cousin's car. Patient then states she will return to ED if need be. Pt signed AMA paperwork, IV discontinued and pt left ambulatory without difficulties. MD aware.

## 2018-04-26 NOTE — ED Notes (Signed)
Discussed holding ativan with MD as pt does not have ride home.

## 2018-04-26 NOTE — ED Notes (Signed)
Pt denies wanting tylenol for her chest tightness at this time and would like to see if the other medications will help her.

## 2018-04-26 NOTE — ED Notes (Signed)
Assumed care of pt for task only. Upon disconnecting pt to be transported to CT, pt stated that she had "pain 10/10 in the chest that was described as tightness. Dr. Simons notified and came to patient's bedside to listen to pt. Pt was 99-100%, HR-125, R-25 at this time. Dr. Simons informs that she will order pain meds, then pt will be transported to CT.

## 2018-04-26 NOTE — ED Notes (Signed)
TRANSFER - OUT REPORT:    Verbal report given to Megan Webster, RN (name) on Melinda Grant  being transferred to medical surgical (unit) for routine progression of care       Report consisted of patient???s Situation, Background, Assessment and   Recommendations(SBAR).     Information from the following report(s) SBAR, ED Summary, MAR and Recent Results was reviewed with the receiving nurse.    Lines:   Peripheral IV 04/26/18 Right Antecubital (Active)   Site Assessment Clean, dry, & intact 04/26/2018 12:46 AM   Phlebitis Assessment 0 04/26/2018 12:46 AM   Infiltration Assessment 0 04/26/2018 12:46 AM   Dressing Status Clean, dry, & intact 04/26/2018 12:46 AM   Dressing Type Transparent;Tape 04/26/2018 12:46 AM   Hub Color/Line Status Green 04/26/2018 12:46 AM   Action Taken Blood drawn 04/26/2018 12:46 AM        Opportunity for questions and clarification was provided.      Patient transported with:   Monitor  Registered Nurse

## 2018-04-26 NOTE — H&P (Signed)
H&P by Elroy Channel,  MD at 04/26/18 1810                Author: Elroy Channel, MD  Service: Hospitalist  Author Type: Physician       Filed: 04/26/18 1823  Date of Service: 04/26/18 1810  Status: Addendum          Editor: Elroy Channel, MD (Physician)          Related Notes: Original Note by Elroy Channel, MD (Physician) filed at 04/26/18 [place]                                  Hospitalist Admission Note      NAME: Melinda Grant    DOB:  08-12-1995    MRN:  [id number]       Room Number:  202/01  @ [facility]        Date/Time:  04/26/2018 6:10 PM      Patient PCP: Susie Cassette, MD   ______________________________________________________________________   Given the patient's current clinical presentation, I have a high level of concern for decompensation if discharged from the emergency department.  Complex decision making was performed, which includes  reviewing the patient's available past medical records, laboratory results, and x-ray films.         My assessment of this patient's clinical condition and my plan of care is as follows.      Assessment / Plan:            Active Problems:     SIRS (systemic inflammatory response syndrome) (HCC) (04/26/2018)         Hyperglycemia in the setting of type 2 diabetes :    Lab Results      Component  Value  Date/Time        Hemoglobin A1c  13.9 (H)  04/26/2018 07:33 AM        patient does not take any insulin at home as she stopped following up with PCP in [place] after she moved to [place] 2 months ago. FSG is downtrending with  subcutaneous insulin. Urine ketones negative.       - Increase lantus to 20 units BID.    - 12 units lispro TID AC with correctional scale normal sensitivity.    - Diabetes consult   - RD consult   - Consistent carb diet   - Volume repletion   - Hypoglycemia protocol.          Lactic acidosis :    Likely as a result of volume depletion due to osmotic diuresis and decreased oral intake. Downtrending.       - Has  received 3.5 L fluid bolus. Currently getting maintenance infusion at 150cc/hr NS.          Asthma exacerbation :    Viral LRTI    Likely triggered by viral illness. flu rapid testing negative. CXR does not show acute process.       - fluticasone inhaler, montelukast, albuterol-ipratropium prn and sch. Will transition to laba-ics combination with saba at discharge.    - guaifenesin sCh. Tessalon PRN for cough.    -             Obesity :       Body mass index is 44.63 kg/m??.       - RD consult.       Nicotine use disorder:       -  Declines nicotine patch.    - Patient was counseled extensively on the need to abstain from tobacco, its addictive tendencies, its deleterious effects on the lungs as well as its financial sequelae      Code Status: FULL    Surrogate Decision Maker: cousin      DVT Prophylaxis: heparin, scd   GI Prophylaxis: not indicated      Baseline: independent               Subjective:     CHIEF COMPLAINT:  shrotness of breath, polyuria, polydipsia       HISTORY OF PRESENT ILLNESS:      Melinda Grant is a  22 y.o.  African American female with PMH of type 2 diabetes, asthma, obesity who presents  to ED with c/o shortness of breath polyuria, polydipsia.       She was living in [place] until 2 months ago when she moved to [place] to live with her cousin. She used to take Glargine 45-50 units twice daily and humalog but has not used insulin in 2 months due to lapse in follow up with PCP. She does not know  if she has type 1 or type 2 diabetes. She is not compliant with consistent carbohydrate diet. She has been experiencing polyuria, polydipsia for the last week with decrease in appetite. She has also had shortness of breath, non productive cough, chest  tightness, wheezing, nasal congestion for the same period. Denies fevers, chills, recent travel, sick contacts. Has not taken flu shot this season.               Past Medical History:        Diagnosis  Date         ?  Asthma           ?  Diabetes  [facility])                Past Surgical History:         Procedure  Laterality  Date          ?  HX HEENT              dental             Social History          Tobacco Use         ?  Smoking status:  Current Every Day Smoker              Packs/day:  0.25         ?  Smokeless tobacco:  Never Used       Substance Use Topics         ?  Alcohol use:  No            History reviewed. No pertinent family history.     Allergies        Allergen  Reactions         ?  Bees [Sting, Bee]  Swelling     ?  Strawberry  Itching             Allergic to "strawberry and banana mixed"              Prior to Admission medications        Not on File           REVIEW OF SYSTEMS:      Total of 12 systems reviewed as follows:  General:  Negative for fever, chills, sweats, weight loss/gain,          Eyes:    Negative for blurred vision, eye pain, loss of vision, double vision   ENT:    rhinorrhea, pharyngitis    Respiratory:   Negative for sputum production   Cardiology:   Negative for chest pain, palpitations, orthopnea, PND, edema, syncope    Gastrointestinal:  Negative for abdominal pain, N/V, diarrhea, dysphagia, constipation, bleeding    Genitourinary:    + for Polyuria, polydipsia. Negative for frequency, urgency, dysuria, hematuria, incontinence    Muskuloskeletal :  Negative for arthralgia, myalgia, back pain   Hematology:  Negative for easy bruising, nose or gum bleeding, lymphadenopathy    Dermatological: Negative for rash, ulceration, pruritis, color change / jaundice   Endocrine:   Negative for hot flashes or polydipsia    Neurological:  Negative for headache, dizziness, confusion, focal weakness, paresthesia,      Speech difficulties, memory loss, gait difficulty   Psychological: Negative for Feelings of anxiety, depression, agitation        Objective:     VITALS:     Visit Vitals      BP  140/79 (BP 1 Location: Left arm, BP Patient Position: At rest)        Pulse  (!) 108     Temp  98.2 ??F (36.8 ??C)     Resp  20     Ht  5'  4" (1.626 m)     Wt  117.9 kg (260 lb)     SpO2  95%        BMI  44.63 kg/m??           PHYSICAL EXAM:      General:    Obese, Alert, cooperative, no distress, appears stated age.      HEENT: Atraumatic, anicteric sclerae, pink conjunctivae      No oral ulcers, mucosa moist, throat clear, dentition fair   Neck:  Supple, symmetrical,  no JVD, thyroid: non tender   Lungs:   Decreased air entry. No Rhonchi. No rales.   Chest wall:  No tenderness  No Accessory muscle use.   Heart:   Regular  rhythm,  No  murmur   No edema   Abdomen:   Soft, non-tender. Not distended.  Bowel sounds normal   Extremities: No cyanosis.  No clubbing,       Skin turgor normal, Capillary refill normal, Radial dial pulse 2+   Skin:     Not pale.  Not Jaundiced  No rashes    Psych:  Good insight.  Not depressed.  Not anxious or agitated.   Neurologic: EOMs intact. No facial asymmetry. No aphasia or slurred speech. Symmetrical strength, Sensation grossly intact. Alert and oriented X 4.       ______________________________________________________________________      Care Plan discussed with:  Patient/Family, Nurse and Case Manager      Expected  Disposition:  Home w/Family   ________________________________________________________________________   TOTAL TIME:  40 Minutes      Critical Care Provided     Minutes non procedure based              Comments             Reviewed previous records         >50% of visit spent in counseling and coordination of care    Discussion with patient and/or family and questions answered  ________________________________________________________________________   Signed: Elroy Channel, MD      Procedures: see electronic medical records for all procedures/Xrays and details which were not copied into this note but were reviewed prior to creation of Plan.      LAB DATA REVIEWED:       Recent Results (from the past 24 hour(s))     GLUCOSE, POC          Collection Time: 04/25/18 11:23 PM         Result  Value   Ref Range            Glucose (POC)  597 (H)  65 - 100 mg/dL       Performed by  Georgeanna Lea         GLUCOSE, POC          Collection Time: 04/25/18 11:25 PM         Result  Value  Ref Range            Glucose (POC)  576 (H)  65 - 100 mg/dL       Performed by  Georgeanna Lea         VENOUS BLOOD GAS          Collection Time: 04/25/18 11:45 PM         Result  Value  Ref Range            VENOUS PH  7.33  7.32 - 7.42         VENOUS PCO2  51  41 - 51 mmHg       VENOUS PO2  25  25 - 40 mmHg       VENOUS O2 SATURATION  40 (L)  65 - 88 %       VENOUS BICARBONATE  26  23 - 28 mmol/L       VENOUS BASE DEFICIT  0.6  mmol/L       O2 METHOD  ROOM AIR          Sample source  VENOUS          SITE  OTHER          EKG, 12 LEAD, INITIAL          Collection Time: 04/25/18 11:58 PM         Result  Value  Ref Range            Ventricular Rate  134  BPM       Atrial Rate  134  BPM       P-R Interval  146  ms       QRS Duration  76  ms       Q-T Interval  290  ms       QTC Calculation (Bezet)  433  ms       Calculated P Axis  71  degrees       Calculated R Axis  63  degrees       Calculated T Axis  54  degrees       Diagnosis                 Sinus tachycardia   RSR' or QR pattern in V1 suggests right ventricular conduction delay   Low voltage QRS   No previous ECGs available   Confirmed by Baird Cancer [phone]) on 04/26/2018 12:00:57 PM          HCG URINE, QL. - POC  Collection Time: 04/26/18 12:14 AM         Result  Value  Ref Range            Pregnancy test,urine (POC)  NEGATIVE   NEG         MAGNESIUM          Collection Time: 04/26/18 12:16 AM         Result  Value  Ref Range            Magnesium  1.4 (L)  1.6 - 2.4 mg/dL       PHOSPHORUS          Collection Time: 04/26/18 12:16 AM         Result  Value  Ref Range            Phosphorus  2.6  2.6 - 4.7 MG/DL       CBC WITH AUTOMATED DIFF          Collection Time: 04/26/18 12:16 AM         Result  Value  Ref Range            WBC  10.2  3.6 - 11.0 K/uL       RBC  4.69   3.80 - 5.20 M/uL       HGB  12.4  11.5 - 16.0 g/dL       HCT  38.2  35.0 - 47.0 %       MCV  81.4  80.0 - 99.0 FL       MCH  26.4  26.0 - 34.0 PG       MCHC  32.5  30.0 - 36.5 g/dL       RDW  12.4  11.5 - 14.5 %       PLATELET  312  150 - 400 K/uL       MPV  10.5  8.9 - 12.9 FL       NRBC  0.0  0 PER 100 WBC       ABSOLUTE NRBC  0.00  0.00 - 0.01 K/uL       NEUTROPHILS  74  32 - 75 %       LYMPHOCYTES  21  12 - 49 %       MONOCYTES  4 (L)  5 - 13 %       EOSINOPHILS  1  0 - 7 %       BASOPHILS  0  0 - 1 %       IMMATURE GRANULOCYTES  0  0.0 - 0.5 %       ABS. NEUTROPHILS  7.5  1.8 - 8.0 K/UL       ABS. LYMPHOCYTES  2.2  0.8 - 3.5 K/UL       ABS. MONOCYTES  0.4  0.0 - 1.0 K/UL       ABS. EOSINOPHILS  0.1  0.0 - 0.4 K/UL       ABS. BASOPHILS  0.0  0.0 - 0.1 K/UL       ABS. IMM. GRANS.  0.0  0.00 - 0.04 K/UL       DF  AUTOMATED          METABOLIC PANEL, COMPREHENSIVE          Collection Time: 04/26/18 12:16 AM         Result  Value  Ref Range            Sodium  135 (L)  136 - 145 mmol/L       Potassium  3.0 (L)  3.5 - 5.1 mmol/L       Chloride  99  97 - 108 mmol/L       CO2  27  21 - 32 mmol/L       Anion gap  9  5 - 15 mmol/L       Glucose  599 (HH)  65 - 100 mg/dL       BUN  7  6 - 20 MG/DL       Creatinine  0.88  0.55 - 1.02 MG/DL       BUN/Creatinine ratio  8 (L)  12 - 20         GFR est AA  >60  >60 ml/min/1.66m       GFR est non-AA  >60  >60 ml/min/1.751m      Calcium  8.9  8.5 - 10.1 MG/DL       Bilirubin, total  0.2  0.2 - 1.0 MG/DL       ALT (SGPT)  10 (L)  12 - 78 U/L       AST (SGOT)  4 (L)  15 - 37 U/L       Alk. phosphatase  96  45 - 117 U/L       Protein, total  6.6  6.4 - 8.2 g/dL       Albumin  3.1 (L)  3.5 - 5.0 g/dL       Globulin  3.5  2.0 - 4.0 g/dL       A-G Ratio  0.9 (L)  1.1 - 2.2         URINALYSIS W/ REFLEX CULTURE          Collection Time: 04/26/18 12:48 AM         Result  Value  Ref Range            Color  YELLOW/STRAW          Appearance  CLEAR  CLEAR         Specific gravity  1.005   1.003 - 1.030         pH (UA)  6.0  5.0 - 8.0         Protein  NEGATIVE   NEG mg/dL       Glucose  >1,000 (A)  NEG mg/dL       Ketone  NEGATIVE   NEG mg/dL       Bilirubin  NEGATIVE   NEG         Blood  NEGATIVE   NEG         Urobilinogen  0.2  0.2 - 1.0 EU/dL       Nitrites  NEGATIVE   NEG         Leukocyte Esterase  SMALL (A)  NEG         WBC  0-4  0 - 4 /hpf       RBC  0-5  0 - 5 /hpf       Epithelial cells  FEW  FEW /lpf       Bacteria  NEGATIVE   NEG /hpf       UA:UC IF INDICATED  CULTURE NOT INDICATED BY UA RESULT  CNI         INFLUENZA A & B AG (RAPID TEST)          Collection Time: 04/26/18  12:48 AM         Result  Value  Ref Range            Influenza A Antigen  NEGATIVE   NEG         Influenza B Antigen  NEGATIVE   NEG         DRUG SCREEN, URINE          Collection Time: 04/26/18  1:43 AM         Result  Value  Ref Range            AMPHETAMINES  NEGATIVE   NEG         BARBITURATES  NEGATIVE   NEG         BENZODIAZEPINES  NEGATIVE   NEG         COCAINE  NEGATIVE   NEG         METHADONE  NEGATIVE   NEG         OPIATES  NEGATIVE   NEG         PCP(PHENCYCLIDINE)  NEGATIVE   NEG         THC (TH-CANNABINOL)  NEGATIVE   NEG         Drug screen comment  (NOTE)         SALICYLATE          Collection Time: 04/26/18  3:26 AM         Result  Value  Ref Range            Salicylate level  <7.0 (L)  2.8 - 20.0 MG/DL       GLUCOSE, POC          Collection Time: 04/26/18  3:28 AM         Result  Value  Ref Range            Glucose (POC)  344 (H)  65 - 100 mg/dL       Performed by  Georgeanna Lea         POC TROPONIN-I          Collection Time: 04/26/18  3:31 AM         Result  Value  Ref Range            Troponin-I (POC)  <0.04  0.00 - 0.08 ng/mL       LACTIC ACID          Collection Time: 04/26/18  4:55 AM         Result  Value  Ref Range            Lactic acid  2.4 (HH)  0.4 - 2.0 MMOL/L       LACTIC ACID          Collection Time: 04/26/18  6:25 AM         Result  Value  Ref Range            Lactic acid  2.6 (HH)  0.4 -  2.0 MMOL/L       CULTURE, BLOOD          Collection Time: 04/26/18  6:25 AM         Result  Value  Ref Range            Special Requests:  NO SPECIAL REQUESTS          Culture result:  NO GROWTH AFTER 4 HOURS  CULTURE, BLOOD          Collection Time: 04/26/18  6:45 AM         Result  Value  Ref Range            Special Requests:  NO SPECIAL REQUESTS               Culture result:  NO GROWTH AFTER 4 HOURS          EKG, 12 LEAD, SUBSEQUENT          Collection Time: 04/26/18  7:32 AM         Result  Value  Ref Range            Ventricular Rate  112  BPM       Atrial Rate  112  BPM       P-R Interval  160  ms       QRS Duration  78  ms       Q-T Interval  336  ms       QTC Calculation (Bezet)  458  ms       Calculated P Axis  72  degrees       Calculated R Axis  74  degrees       Calculated T Axis  59  degrees       Diagnosis                 Sinus tachycardia   RSR' or QR pattern in V1 suggests right ventricular conduction delay   Otherwise normal ECG      Confirmed by Baird Cancer [phone]) on 04/26/2018 12:00:31 PM          LIPID PANEL          Collection Time: 04/26/18  7:33 AM         Result  Value  Ref Range            LIPID PROFILE               Cholesterol, total  138  <200 MG/DL       Triglyceride  52  <150 MG/DL       HDL Cholesterol  38  MG/DL       LDL, calculated  89.6  0 - 100 MG/DL       VLDL, calculated  10.4  MG/DL       CHOL/HDL Ratio  3.6  0.0 - 5.0         HEMOGLOBIN A1C WITH EAG          Collection Time: 04/26/18  7:33 AM         Result  Value  Ref Range            Hemoglobin A1c  13.9 (H)  4.2 - 6.3 %       Est. average glucose  352  mg/dL       METABOLIC PANEL, BASIC          Collection Time: 04/26/18  7:33 AM         Result  Value  Ref Range            Sodium  135 (L)  136 - 145 mmol/L       Potassium  3.9  3.5 - 5.1 mmol/L       Chloride  102  97 - 108 mmol/L       CO2  22  21 - 32 mmol/L  Anion gap  11  5 - 15 mmol/L       Glucose  419 (H)  65 - 100 mg/dL       BUN  6  6 - 20  MG/DL       Creatinine  0.65  0.55 - 1.02 MG/DL       BUN/Creatinine ratio  9 (L)  12 - 20         GFR est AA  >60  >60 ml/min/1.32m       GFR est non-AA  >60  >60 ml/min/1.777m      Calcium  8.6  8.5 - 10.1 MG/DL       MAGNESIUM          Collection Time: 04/26/18  7:33 AM         Result  Value  Ref Range            Magnesium  3.0 (H)  1.6 - 2.4 mg/dL       PHOSPHORUS          Collection Time: 04/26/18  7:33 AM         Result  Value  Ref Range            Phosphorus  3.5  2.6 - 4.7 MG/DL       GLUCOSE, POC          Collection Time: 04/26/18  7:36 AM         Result  Value  Ref Range            Glucose (POC)  400 (H)  65 - 100 mg/dL       Performed by  ViLenora BoysEDT)         POC TROPONIN-I          Collection Time: 04/26/18  9:05 AM         Result  Value  Ref Range            Troponin-I (POC)  <0.04  0.00 - 0.08 ng/mL       GLUCOSE, POC          Collection Time: 04/26/18  9:06 AM         Result  Value  Ref Range            Glucose (POC)  528 (H)  65 - 100 mg/dL       Performed by  ViLenora BoysEDT)         GLUCOSE, POC          Collection Time: 04/26/18  9:11 AM         Result  Value  Ref Range            Glucose (POC)  469 (H)  65 - 100 mg/dL       Performed by  ViLenora BoysEDT)         GLUCOSE, POC          Collection Time: 04/26/18 10:51 AM         Result  Value  Ref Range            Glucose (POC)  424 (H)  65 - 100 mg/dL       Performed by  [employer])         LACTIC ACID          Collection Time: 04/26/18 11:16 AM         Result  Value  Ref Range            Lactic acid  2.1 (HH)  0.4 - 2.0 MMOL/L       GLUCOSE, POC          Collection Time: 04/26/18  4:54 PM         Result  Value  Ref Range            Glucose (POC)  343 (H)  65 - 100 mg/dL            Performed by  Kate Sable (PCT)

## 2018-04-26 NOTE — ED Notes (Signed)
 Assumed care of pt for task only. Upon disconnecting pt to be transported to CT, pt stated that she had pain 10/10 in the chest that was described as tightness. Dr. Creston notified and came to patient's bedside to listen to pt. Pt was 99-100%, HR-125, R-25 at this time. Dr. Creston informs that she will order pain meds, then pt will be transported to CT.

## 2018-04-26 NOTE — Progress Notes (Signed)
 Reason for Admission:   Per  Dr. Creston ED Admit Note: 22 y.o. female with his past medical history of insulin-dependent diabetes, asthma, presents to the emergency room for bilateral flank pain, urinary urgency/frequency, chest tightness, cough, shortness of breath for 2 days.                   RRAT Score:  4                   Plan for utilizing home health:  TBD - CM will assist medical team to arrange for home health if ordered.                        Current Advanced Directive/Advance Care Plan: Pt is a Full Code Status. No advanced directive documents on chart. Medical records identifies the      Healthcare Decision Maker as pt's mother,  Dike-Alexander, Hubert [phone]).                Transition of Care Plan:  CM met with pt. She reported she had been experiencing cough x 1 day and pain in side x 2 days. Pt confirmed her home address and contact phone number as documented in chart. She lives in an apartment with her adult cousin and cousin's 5 children (3yi0, 2yo twins, and 1yo twins). 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 Pt reported she has a car and drove herself to the ED. She has no medical insurance coverage at this time. Pt reported she had [employer] that became inactive as of 08/22/17. CM reported a Med[employer]ist referral would be made while in the hospital to assist with reapplying for Medicaid. Pt reported she last saw a PCP through [facility] She was unable to recall when her last appointment was with the PCP. CM agreed to contact [facility] to arrange for a post-discharge f/u appointment. [facility] reported pt last saw Arby Plume, NP on 05/12/2016. A f/u appointment was arranged with Ms. Staples on Thursday, Sept. 26 at 1430. [facility] requested for CM to fax over the medical records when pt is d/c (fax # [phone]). CM reported a medication voucher may be offered for d/c prescriptions as  approved by [facility]. Pt is to be admitted to the medical unit today and CM will f/u with pt and medical team to assist with d/c planning.                     Care Management Interventions  PCP Verified by CM: Yes(Juliette Staples, NP [facility]))  Last Visit to PCP: 05/12/16  Mode of Transport at Discharge: Self(pt has a car and drives herself)  Transition of Care Consult (CM Consult): Discharge Planning  Current Support Network: (pt lives with adult cousin and cousin's 5 children)  Confirm Follow Up Transport: Self  Plan discussed with Pt/Family/Caregiver: Yes  Discharge Location  Discharge Placement: Home

## 2018-04-26 NOTE — Progress Notes (Signed)
Patient decided to leave AMA. Pt educated on necessity of treatment and risks associated with discharging prematurely. Patient adamant about leaving, stating she has to return her cousin's car. Patient then states she will return to ED if need be. Pt signed AMA paperwork, IV discontinued and pt left ambulatory without difficulties. MD aware.

## 2018-04-26 NOTE — ED Notes (Signed)
Tried to call report upstairs. No nurse available.

## 2018-04-26 NOTE — Progress Notes (Signed)
[facility]: MED RECONCILIATION    Comments/Recommendations: Patient denies currently taking any prescription or OTC medications.    Medications removed:    Albuterol HFA  Albuterol Nebulizer  QVAR 40 mcg  Novolin 70/30   Lantus  Loratadine  Montelukast    Information obtained from: patient & previous medical records    Allergies: Bees [sting, bee] and Strawberry    Prior to Admission Medications:     None     Thank you,    Gwen HerStephanie A Culbertson, PHARMD, BCPS  Contact: [phone]

## 2018-04-26 NOTE — Discharge Summary (Signed)
Discharge Summary by Lovina ReachKamath, Yunus Stoklosa, MD at 04/26/18 2321                Author: Lovina ReachKamath, Lidia Clavijo, MD  Service: Hospitalist  Author Type: Physician       Filed: 04/27/18 1757  Date of Service: 04/26/18 2321  Status: Addendum          Editor: Lovina ReachKamath, Cynethia Schindler, MD (Physician)          Related Notes: Original Note by Lovina ReachKamath, Neosha Switalski, MD (Physician) filed at 04/27/18 1756                                       Hospitalist Discharge Summary        Patient ID:   Melinda Grant   [id number]   22 y.o.   25-Jul-1996      PCP on record: Consuella LoseKing, Carla, MD      Admit date: 04/25/2018   Discharge date and time: 04/27/2018         Admission Diagnoses: SIRS (systemic inflammatory response syndrome) (HCC) [R65.10]      Discharge Diagnoses:     Active Problems:     SIRS (systemic inflammatory response syndrome) (HCC) (04/26/2018)                Hospital Course:    Patient left against medical advice before resolution of acute illness overnight stating that she needed to return her cousin's car and would return to the ED if needed. She was fully informed of risks of leaving AMA including death.             Hyperglycemia in the setting of type 2 diabetes, A1C 13.9 %:                      Lab Results         Component  Value  Date/Time          ??  Hemoglobin A1c  13.9 (H)  04/26/2018 07:33 AM     patient does not take any insulin at home as she stopped following up with PCP in [place] after she moved to [place] 2 months ago. Urine ketones negative.  She was administered long and short acting insulin with downtrending fingersticks and provided volume repletion.    Last FSG prior to discharge was 340.    ??   ??   Lactic acidosis :    Likely as a result of volume depletion due to osmotic diuresis and decreased oral intake. Received fluid bolus NS and maintenance infusion.    ??   ??   Asthma exacerbation :    Viral LRTI    Likely triggered by viral illness. flu rapid testing negative. CXR does not show acute process.    fluticasone inhaler,  montelukast, albuterol-ipratropium prn and sch.    ??   ??   ??   Obesity :    Body mass index is 44.63 kg/m??.    RD consult was placed.    ??   Nicotine use disorder:   - Declines nicotine patch.    - Patient was counseled extensively on admission on the need to abstain from tobacco, its addictive tendencies, its deleterious effects on the lungs as well as its financial sequelae   ??               CONSULTATIONS:   None  Excerpted HPI from H&P of Siddharth A Munsif, MD:   22 y.o.  African American female with PMH of type 2 diabetes, asthma, obesity who presents to ED with c/o shortness of breath polyuria, polydipsia.    ??   She was living in [place] until 2 months ago when she moved to [place] to live with her cousin. She used to take Glargine 45-50 units twice daily and humalog but has not used insulin in 2 months due to lapse in follow up with PCP. She does not know  if she has type 1 or type 2 diabetes. She is not compliant with consistent carbohydrate diet. She has been experiencing polyuria, polydipsia for the last week with decrease in appetite. She has also had shortness of breath, non productive cough, chest  tightness, wheezing, nasal congestion for the same period. Denies fevers, chills, recent travel, sick contacts. Has not taken flu shot this season.    ??      ______________________________________________________________________   DISCHARGE SUMMARY/HOSPITAL COURSE:   for full details see H&P, daily progress notes, labs, consult notes.                   _______________________________________________________________________   Patient left AMA overnight and not examined by this writer on discharge day.    _______________________________________________________________________   DISCHARGE MEDICATIONS:      Discharge Medication List as of 04/27/2018  6:43 AM                  My Recommended Diet, Activity, Wound Care, and follow-up labs are listed in the patient's Discharge Insturctions which I have  personally completed and reviewed.      _______________________________________________________________________      DISPOSITION:  Against medical advice                                                               Condition at Discharge:  Stable   _______________________________________________________________________   Follow up with:    PCP : Consuella Lose, MD     Follow-up Information               Follow up With  Specialties  Details  Why  Contact Info              Anitra Lauth, NP    On 05/03/2018  You have an appointment with your PCP, Alger Memos, NP on Thursday. Sept. 26 at 2:30pm/  [facility]   [facility]   [address]   [place], [place] [address]   [phone]                      Total time in minutes spent coordinating this discharge (includes going over instructions, follow-up, prescriptions, and preparing report for sign off to her PCP) :  10 minutes      Signed:   Lovina Reach, MD

## 2018-04-26 NOTE — ED Provider Notes (Signed)
EMERGENCY DEPARTMENT HISTORY AND PHYSICAL EXAM      Date: 04/25/2018  Patient Name: Melinda Grant    History of Presenting Illness     HPI: Melinda Grant is a 22 y.o. female with his past medical history of insulin-dependent diabetes, asthma, presents to the emergency room for bilateral flank pain, urinary urgency/frequency, chest tightness, cough, shortness of breath for 2 days.  Patient reports she has been using her inhaler without relief.  She says that she is noncompliant with all of her medications.  She has not checked her blood sugar recently.  She denies fever, nausea vomiting, peripheral edema, syncope, among other associated symptoms.    Pertinent social history: Current smoker    Pertinent surgical history: None    PCP: Susie Cassette, MD    Current Facility-Administered Medications   Medication Dose Route Frequency Provider Last Rate Last Dose   ??? LORazepam (ATIVAN) injection 1 mg  1 mg IntraVENous NOW Elvin So, MD       ??? magnesium sulfate 2 g/50 ml IVPB (premix or compounded)  2 g IntraVENous ONCE Munsif, Siddharth A, MD 50 mL/hr at 04/26/18 0638 2 g at 04/26/18 0626   ??? sodium chloride (NS) flush 5-40 mL  5-40 mL IntraVENous Q8H [place], [place], [place]   10 mL at 04/26/18 0531   ??? sodium chloride (NS) flush 5-40 mL  5-40 mL IntraVENous PRN Jacinta Shoe, PA         Current Outpatient Medications   Medication Sig Dispense Refill   ??? albuterol sulfate (PROVENTIL;VENTOLIN) 2.5 mg/0.5 mL Nebu 2.5 mg by Nebulization route as needed.     ??? albuterol (PROVENTIL, VENTOLIN) 90 mcg/Actuation inhaler Take 2 Puffs by inhalation every six (6) hours as needed.     ??? beclomethasone (QVAR) 40 mcg/Actuation inhaler Take 1 Puff by inhalation two (2) times a day.     ??? loratadine (CLARITIN) 10 mg tablet Take 10 mg by mouth daily.     ??? montelukast (SINGULAIR) 10 mg tablet Take 10 mg by mouth daily.     ??? insulin glargine (LANTUS) 100 unit/mL injection 50 Units by SubCUTAneous route once. Daily in AM      ??? insulin  (NOVOLIN 70/30) 100 unit/mL (70-30) injection by SubCUTAneous route once. Sliding scale, with meals and at bedtime          Past History     Past Medical History:  Past Medical History:   Diagnosis Date   ??? Asthma    ??? Diabetes ([place])        Past Surgical History:  Past Surgical History:   Procedure Laterality Date   ??? HX HEENT      dental       Family History:  History reviewed. No pertinent family history.    Social History:  Social History     Tobacco Use   ??? Smoking status: Current Every Day Smoker     Packs/day: 0.25   ??? Smokeless tobacco: Never Used   Substance Use Topics   ??? Alcohol use: No   ??? Drug use: No       Allergies:  Allergies   Allergen Reactions   ??? Bees [Sting, Bee] Swelling   ??? Strawberry Itching     Allergic to "strawberry and banana mixed"         Review of Systems   Review of Systems   Constitutional: Positive for fatigue. Negative for chills and fever.   HENT: Positive for congestion. Negative for sore  throat.    Respiratory: Positive for cough, chest tightness, shortness of breath and wheezing.    Cardiovascular: Positive for chest pain. Negative for leg swelling.   Gastrointestinal: Negative for abdominal pain, nausea and vomiting.   Endocrine: Positive for polydipsia, polyphagia and polyuria.   Genitourinary: Positive for flank pain, frequency and urgency. Negative for pelvic pain, vaginal bleeding and vaginal discharge.        Denies pregnancy   Neurological: Positive for light-headedness. Negative for syncope, speech difficulty and headaches.       Physical Exam     Vitals:    04/26/18 0511 04/26/18 0542 04/26/18 0546 04/26/18 0632   BP:       Pulse: (!) 117 (!) 117     Resp: 26 21     Temp:    97.7 ??F (36.5 ??C)   SpO2: 94% 94% 94%    Weight:       Height:         Physical Exam   Constitutional: She is oriented to person, place, and time. She appears well-developed and well-nourished. No distress.   HENT:   Head: Normocephalic and atraumatic.   Right Ear: External ear normal.   Left Ear:  External ear normal.   Mouth/Throat: No oropharyngeal exudate.   TM's normal bilaterally, dry mucous membranes   Neck: Normal range of motion. Neck supple.   Cardiovascular: Regular rhythm, normal heart sounds and intact distal pulses.   Tachycardic   Pulmonary/Chest: Breath sounds normal. She is in respiratory distress. She has no wheezes. She has no rales. She exhibits tenderness.   Abdominal: Soft. Bowel sounds are normal. She exhibits no distension and no mass. There is no tenderness. There is no rebound and no guarding.   No CVA tenderness   Musculoskeletal: She exhibits no edema.   No calf tenderness, calf swelling, pitting edema   Lymphadenopathy:     She has no cervical adenopathy.   Neurological: She is alert and oriented to person, place, and time.   Skin: Skin is warm and dry. No rash noted. She is not diaphoretic. No erythema. No pallor.   Psychiatric: She has a normal mood and affect. Her behavior is normal. Judgment and thought content normal.   Nursing note and vitals reviewed.        Diagnostic Study Results     Labs -     Recent Results (from the past 12 hour(s))   GLUCOSE, POC    Collection Time: 04/25/18 11:23 PM   Result Value Ref Range    Glucose (POC) 597 (H) 65 - 100 mg/dL    Performed by Georgeanna Lea    GLUCOSE, POC    Collection Time: 04/25/18 11:25 PM   Result Value Ref Range    Glucose (POC) 576 (H) 65 - 100 mg/dL    Performed by Georgeanna Lea    VENOUS BLOOD GAS    Collection Time: 04/25/18 11:45 PM   Result Value Ref Range    VENOUS PH 7.33 7.32 - 7.42      VENOUS PCO2 51 41 - 51 mmHg    VENOUS PO2 25 25 - 40 mmHg    VENOUS O2 SATURATION 40 (L) 65 - 88 %    VENOUS BICARBONATE 26 23 - 28 mmol/L    VENOUS BASE DEFICIT 0.6 mmol/L    O2 METHOD ROOM AIR      Sample source VENOUS      SITE OTHER     HCG URINE, QL. -  POC    Collection Time: 04/26/18 12:14 AM   Result Value Ref Range    Pregnancy test,urine (POC) NEGATIVE  NEG     MAGNESIUM    Collection Time: 04/26/18 12:16 AM   Result  Value Ref Range    Magnesium 1.4 (L) 1.6 - 2.4 mg/dL   PHOSPHORUS    Collection Time: 04/26/18 12:16 AM   Result Value Ref Range    Phosphorus 2.6 2.6 - 4.7 MG/DL   CBC WITH AUTOMATED DIFF    Collection Time: 04/26/18 12:16 AM   Result Value Ref Range    WBC 10.2 3.6 - 11.0 K/uL    RBC 4.69 3.80 - 5.20 M/uL    HGB 12.4 11.5 - 16.0 g/dL    HCT 38.2 35.0 - 47.0 %    MCV 81.4 80.0 - 99.0 FL    MCH 26.4 26.0 - 34.0 PG    MCHC 32.5 30.0 - 36.5 g/dL    RDW 12.4 11.5 - 14.5 %    PLATELET 312 150 - 400 K/uL    MPV 10.5 8.9 - 12.9 FL    NRBC 0.0 0 PER 100 WBC    ABSOLUTE NRBC 0.00 0.00 - 0.01 K/uL    NEUTROPHILS 74 32 - 75 %    LYMPHOCYTES 21 12 - 49 %    MONOCYTES 4 (L) 5 - 13 %    EOSINOPHILS 1 0 - 7 %    BASOPHILS 0 0 - 1 %    IMMATURE GRANULOCYTES 0 0.0 - 0.5 %    ABS. NEUTROPHILS 7.5 1.8 - 8.0 K/UL    ABS. LYMPHOCYTES 2.2 0.8 - 3.5 K/UL    ABS. MONOCYTES 0.4 0.0 - 1.0 K/UL    ABS. EOSINOPHILS 0.1 0.0 - 0.4 K/UL    ABS. BASOPHILS 0.0 0.0 - 0.1 K/UL    ABS. IMM. GRANS. 0.0 0.00 - 0.04 K/UL    DF AUTOMATED     METABOLIC PANEL, COMPREHENSIVE    Collection Time: 04/26/18 12:16 AM   Result Value Ref Range    Sodium 135 (L) 136 - 145 mmol/L    Potassium 3.0 (L) 3.5 - 5.1 mmol/L    Chloride 99 97 - 108 mmol/L    CO2 27 21 - 32 mmol/L    Anion gap 9 5 - 15 mmol/L    Glucose 599 (HH) 65 - 100 mg/dL    BUN 7 6 - 20 MG/DL    Creatinine 0.88 0.55 - 1.02 MG/DL    BUN/Creatinine ratio 8 (L) 12 - 20      GFR est AA >60 >60 ml/min/1.11m    GFR est non-AA >60 >60 ml/min/1.717m   Calcium 8.9 8.5 - 10.1 MG/DL    Bilirubin, total 0.2 0.2 - 1.0 MG/DL    ALT (SGPT) 10 (L) 12 - 78 U/L    AST (SGOT) 4 (L) 15 - 37 U/L    Alk. phosphatase 96 45 - 117 U/L    Protein, total 6.6 6.4 - 8.2 g/dL    Albumin 3.1 (L) 3.5 - 5.0 g/dL    Globulin 3.5 2.0 - 4.0 g/dL    A-G Ratio 0.9 (L) 1.1 - 2.2     URINALYSIS W/ REFLEX CULTURE    Collection Time: 04/26/18 12:48 AM   Result Value Ref Range    Color YELLOW/STRAW      Appearance CLEAR CLEAR      Specific  gravity 1.005 1.003 - 1.030  pH (UA) 6.0 5.0 - 8.0      Protein NEGATIVE  NEG mg/dL    Glucose >1,000 (A) NEG mg/dL    Ketone NEGATIVE  NEG mg/dL    Bilirubin NEGATIVE  NEG      Blood NEGATIVE  NEG      Urobilinogen 0.2 0.2 - 1.0 EU/dL    Nitrites NEGATIVE  NEG      Leukocyte Esterase SMALL (A) NEG      WBC 0-4 0 - 4 /hpf    RBC 0-5 0 - 5 /hpf    Epithelial cells FEW FEW /lpf    Bacteria NEGATIVE  NEG /hpf    UA:UC IF INDICATED CULTURE NOT INDICATED BY UA RESULT CNI     INFLUENZA A & B AG (RAPID TEST)    Collection Time: 04/26/18 12:48 AM   Result Value Ref Range    Influenza A Antigen NEGATIVE  NEG      Influenza B Antigen NEGATIVE  NEG     DRUG SCREEN, URINE    Collection Time: 04/26/18  1:43 AM   Result Value Ref Range    AMPHETAMINES NEGATIVE  NEG      BARBITURATES NEGATIVE  NEG      BENZODIAZEPINES NEGATIVE  NEG      COCAINE NEGATIVE  NEG      METHADONE NEGATIVE  NEG      OPIATES NEGATIVE  NEG      PCP(PHENCYCLIDINE) NEGATIVE  NEG      THC (TH-CANNABINOL) NEGATIVE  NEG      Drug screen comment (NOTE)    SALICYLATE    Collection Time: 04/26/18  3:26 AM   Result Value Ref Range    Salicylate level <9.4 (L) 2.8 - 20.0 MG/DL   GLUCOSE, POC    Collection Time: 04/26/18  3:28 AM   Result Value Ref Range    Glucose (POC) 344 (H) 65 - 100 mg/dL    Performed by Georgeanna Lea    POC TROPONIN-I    Collection Time: 04/26/18  3:31 AM   Result Value Ref Range    Troponin-I (POC) <0.04 0.00 - 0.08 ng/mL   LACTIC ACID    Collection Time: 04/26/18  4:55 AM   Result Value Ref Range    Lactic acid 2.4 (HH) 0.4 - 2.0 MMOL/L       Radiologic Studies -   CTA CHEST W OR W WO CONT   Final Result   IMPRESSION:    There is no pulmonary embolism.   There is no aortic aneurysm or dissection.   No acute intrathoracic process          XR CHEST PORT   Final Result   IMPRESSION:   No acute process.                     CT Results  (Last 48 hours)               04/26/18 0257  CTA CHEST W OR W WO CONT Final result    Impression:   IMPRESSION:    There is no pulmonary embolism.   There is no aortic aneurysm or dissection.   No acute intrathoracic process            Narrative:  CLINICAL HISTORY: Chest pain       INDICATION: Chest pain       COMPARISON: 11/14/2008       TECHNIQUE: CT of the chest with  IV contrast , Isovue-370 is performed. Axial   images from the thoracic inlet to the level of the upper abdomen are obtained.   Manual post-processing of the images and coronal reformatting is also performed.   CT dose reduction was achieved through use of a standardized protocol tailored   for this examination and automatic exposure control for dose modulation.   Multiplanar reformatted imaging was performed.   Sagittal and coronal reformatting.    3-D Postprocessing of imaging was performed.    3-D MIP reconstructed images were obtained in the coronal plane.       FINDINGS:    There is no pulmonary embolism.   There is no aortic aneurysm or dissection.       Hepatic steatosis. There is no pleural or pericardial effusion. There is no   mediastinal, axillary or hilar lymphadenopathy. The aorta is normal in course   and caliber. The proximal pulmonary arteries are without evidence of filling   defects. No lytic or blastic lesions are identified. The remainder of the upper   abdomen visualized is unremarkable.               CXR Results  (Last 48 hours)               04/26/18 0003  XR CHEST PORT Final result    Impression:  IMPRESSION:   No acute process.                        Narrative:  PORTABLE CHEST RADIOGRAPH/S: 04/26/2018 12:03 AM       Clinical history: DKA and tachycardia   INDICATION:   dka, tachycardia   COMPARISON: 2010       FINDINGS:   AP portable upright view of the chest demonstrates a stable  cardiopericardial   silhouette. The lungs are adequately expanded.  There is no edema, effusion,   consolidation, or pneumothorax. The osseous structures are unremarkable. Patient   is on a cardiac monitor.                      EKG:  Interpreted  by Dr. Jamal Collin  Time Interpreted: 0100  Rate: 134.Sinus tach. S1 Q3and ?biphasic T wave in 3. No obvious st elevation or depression. No ectopy.     ED Course and Progress notes:   Initial assessment performed. The patients presenting problems have been discussed, and they are in agreement with the care plan formulated and outlined with them.  I have encouraged them to ask questions as they arise throughout their visit.    ED Course as of Apr 26 645   Thu Apr 26, 2018   0208 99%RA. Breath sounds improved. Still c/o chest pain. Analgesia ordered.    [SS]   [id number] Negative work-up.  Still tachycardic.  States she is breathing better. Unclear why she has this degree of tachycardia-- possibly substance abuse/withdrawal vs dehydration vs med effect from albuterol.  Per EDIE patient has been seen at a hospital in [place] 7 times since she was last seen here.  PMP search under her name did not show any prescriptions for controlled substances.    [SS]   0451 Heart rate down to 115 now.     [SS]   3875 Persistently tachycardic. Went to reassess patient. While she stated her breathing had improved, on auscultation she still has persistent wheeze. Review of old records shows the last time she had this degree of tachycardia she  was having an asthma exac.  Will likely admit for asthma exac.    [SS]   0160 Accepted by Dr. Vivi Barrack for admission.    [SS]      ED Course User Index  [SS] Elvin So, MD       Vital Signs-Reviewed the patient's vital signs.  Vitals:    04/26/18 0511 04/26/18 0542 04/26/18 0546 04/26/18 0632   BP:       Pulse: (!) 117 (!) 117     Resp: 26 21     Temp:    97.7 ??F (36.5 ??C)   SpO2: 94% 94% 94%    Weight:       Height:           Medications Administered During ED Course  Medications   sodium chloride (NS) flush 5-40 mL (10 mL IntraVENous Given 04/26/18 0531)   sodium chloride (NS) flush 5-40 mL (has no administration in time range)   LORazepam (ATIVAN) injection 1 mg (has no administration in time range)    magnesium sulfate 2 g/50 ml IVPB (premix or compounded) (2 g IntraVENous New Bag 04/26/18 0638)   sodium chloride 0.9 % bolus infusion 1,000 mL (0 mL IntraVENous IV Completed 04/26/18 0246)   sodium chloride 0.9 % bolus infusion 1,000 mL (0 mL IntraVENous IV Completed 04/26/18 0146)   albuterol-ipratropium (DUO-NEB) 2.5 MG-0.5 MG/3 ML (3 mL Nebulization Given 04/26/18 0042)   insulin regular (NOVOLIN R, HUMULIN R) injection 10 Units (10 Units IntraVENous Given 04/26/18 0135)   potassium chloride SR (KLOR-CON 10) tablet 40 mEq (40 mEq Oral Given 04/26/18 0136)   magnesium sulfate 2 g/50 ml IVPB (premix or compounded) (0 g IntraVENous IV Completed 04/26/18 0238)   iopamidol (ISOVUE-370) 76 % injection 100 mL (100 mL IntraVENous Given 04/26/18 0158)   morphine injection 4 mg (4 mg IntraVENous Given 04/26/18 0232)   albuterol-ipratropium (DUO-NEB) 2.5 MG-0.5 MG/3 ML (6 mL Nebulization Given 04/26/18 0339)   sodium chloride 0.9 % bolus infusion 1,000 mL (0 mL IntraVENous IV Completed 04/26/18 0431)   methylPREDNISolone (PF) (Solu-MEDROL) injection 125 mg (125 mg IntraVENous Given 04/26/18 0531)   albuterol (PROVENTIL VENTOLIN) nebulizer solution 5 mg (5 mg Nebulization Given 04/26/18 0547)   potassium chloride SR (KLOR-CON 10) tablet 40 mEq (40 mEq Oral Given 04/26/18 0623)     1:09 AM  Pt care assumed by Dr. Jamal Collin, at this time. He has been updated on pt's hx, sxs, vitals, exam, labs pending/ resulted, and imaging ordered. Provider is in agreement with care plan at this time.     CRITICAL CARE NOTE :    6:30 AM      IMPENDING DETERIORATION -Respiratory, Cardiovascular, CNS and Metabolic    ASSOCIATED RISK FACTORS - Hypotension, Shock, Hypoxia, Dysrhythmia, Metabolic changes, Vascular Compromise and CNS Decompensation    MANAGEMENT- Bedside Assessment and Supervision of Care    INTERPRETATION -  Xrays, CT Scan, ECG, Blood Pressure and Cardiac Output Measures     INTERVENTIONS - hemodynamic mngmt, transcutaneous pacing, vascular  control, Neurologic interventions  and Metobolic interventions    CASE REVIEW - Hospitalist and Nursing    TREATMENT RESPONSE -Improved    PERFORMED BY - Self        NOTES   :      I have spent 78 minutes of critical care time involved in lab review, consultations with specialist, family decision- making, bedside attention and documentation. During this entire length of time I was immediately available to the  patient .    Elvin So, MD        Disposition:  admit    Follow-up Information    None         Current Discharge Medication List          Provider Notes (Medical Decision Making):   Differential diagnosis: DKA, dehydration, influenza, asthma exacerbation, pneumonia, bronchitis, UTI, pyelonephritis      Diagnosis     Clinical Impression:   1. Moderate persistent asthma with acute exacerbation    2. Hypomagnesemia    3. Hyperglycemia    4. Tachycardia        Please note that this dictation was completed with Dragon, the computer voice recognition software.  Quite often unanticipated grammatical, syntax, homophones, and other interpretive errors are inadvertently transcribed by the computer software.  Please disregard these errors.  Please excuse any errors that have escaped final proofreading.    This note will not be viewable in [place].

## 2018-04-26 NOTE — ED Notes (Signed)
Patient has been instructed that they have been given morphine which contains opioids, benzodiazepines, or other sedating drugs. Patient is aware that they  will need to refrain from driving or operating heavy machinery after taking this medication.  Patient also instructed that they need to avoid drinking alcohol and using other products containing opioids, benzodiazepines, or other sedating drugs.  Patient verbalized understanding.      Pt drove here and does not have a ride. Conversed with MD and charge nurse. Pt having 10/10 chest pain sob. Pt also highly anxious and need to fo down for CT scan to r/o PE. Pt may be admitted or, at least , is far from discharge at this point.

## 2018-04-26 NOTE — ED Notes (Signed)
Discussed holding ativan with MD as pt does not have ride home.

## 2018-04-26 NOTE — ED Notes (Signed)
 TRANSFER - OUT REPORT:    Verbal report given to Megan Webster, RN (name) on Melinda Grant  being transferred to medical surgical (unit) for routine progression of care       Report consisted of patient's Situation, Background, Assessment and   Recommendations(SBAR).     Information from the following report(s) SBAR, ED Summary, [facility] and Recent Results was reviewed with the receiving nurse.    Lines:   Peripheral IV 04/26/18 Right Antecubital (Active)   Site Assessment Clean, dry, & intact 04/26/2018 12:46 AM   Phlebitis Assessment 0 04/26/2018 12:46 AM   Infiltration Assessment 0 04/26/2018 12:46 AM   Dressing Status Clean, dry, & intact 04/26/2018 12:46 AM   Dressing Type Transparent;Tape 04/26/2018 12:46 AM   Hub Color/Line Status Green 04/26/2018 12:46 AM   Action Taken Blood drawn 04/26/2018 12:46 AM        Opportunity for questions and clarification was provided.      Patient transported with:   Monitor  Registered Nurse

## 2018-04-26 NOTE — ED Notes (Signed)
Bedside and Verbal shift change report given to Katie RN (oncoming nurse) by Jari SportsmanMegumi RN (offgoing nurse). Report included the following information SBAR, Kardex, ED Summary, Intake/Output, MAR, Recent Results and Med Rec Status.

## 2018-04-26 NOTE — Progress Notes (Signed)
Notified that Pt has decided to leave AMA.

## 2018-04-26 NOTE — ED Notes (Signed)
 Pt presents ambulatory to ED complaining of SOB, cough, lower back pain, and my sugar is probably up x 2 days. Pt reports being out of her insulin (lantus and Humalog) and metformin for over 2 months. Pt also reports not having and inhaler for her asthma. Pt is alert and oriented x 4, RR dyspnea at rest, skin is warm and dry. Assesment completed and pt updated on plan of care.       Emergency Department Nursing Plan of Care       The Nursing Plan of Care is developed from the Nursing assessment and Emergency Department Attending provider initial evaluation.  The plan of care may be reviewed in the "ED Provider note".    The Plan of Care was developed with the following considerations:   Patient / Family readiness to learn indicated ab:czmajopszi understanding  Persons(s) to be included in education: patient  Barriers to Learning/Limitations:No    Signed     Megumi A Miyajima-Olguin, RN    04/26/2018   1:23 AM

## 2018-04-26 NOTE — ED Notes (Signed)
telehospitalist in room

## 2018-04-26 NOTE — ED Notes (Signed)
Pt denies wanting tylenol for her chest tightness at this time and would like to see if the other medications will help her.

## 2018-04-26 NOTE — H&P (Signed)
**Consult Information**  Member Facility: [facility]  Facility MRN: [id number]  Facility Time Zone: ET  Date and Time of Consult: 04/26/2018 05:40:18 AM  Requesting Clinician: Dr. Mertha Finders  Patient Name: Melinda Grant  Date of Birth: June 11, 1996  Gender: Female    **Problem/Plan**  SIRS: ? sec to hyperglycemic crisis.    - however, no nausea/emesis.     -  bilateral flank pain with normal urine.  ? musculoskeletal etiology.        - check blood cx. trend lactic acid   - IV hydration, BS control.    - replace electrolytes.  Hyperlgycemia: with normal AG  - non compliant with insulin regimen  - start lantus 20 units with humalog 6 units tid, SSI w/accuchecks  - hgb A1C, lipid panel.    - diabetic education.  Shortness of breath: - with clear phlegm  - ? asthma exacerbation  - bronchodilators, low dose steroids, mucinex.  Tobacco use: counselled on cessation.   - nicotine patch.  Morbid obesity: will benefit from weight loss, dietary and lifestyle modifications.    **General**  Code Status: Full Code  History of Present Illness: Pt is a 22 yr old female with tobacco use, asthma, IDDM who presents with multiple symptoms including dyspnea, bilateral flank pain, chest discomfort, shortness of breath ongoing for 2 days.       Has been experiencing bilateral flank pain for the past 2 days. No dysuria or hematuria, No fever/chills. No back pain. However, this morning, felt very short of breath with clear phlegm. Therefore, decided to come to ED.     Has not been compliant with insulin therapy - has been taking metformin only for the past 2 months.  No hospitalizations recently though.     Does smoke cigarettes regularly.     ED course - HR markedly elevated. BS in 500s. CT chest negative for PE. UA negative for UTI. Lactic acid - 2.5  Review of Systems: As per HPI, otherwise, rest of the ROS reviewed and negative.  Past Medical History: Asthma  DM II  Surgical History: Dental  procedure  Social History: everyday smoker  No alcohol use    **Allergies and Medications**  Allergies: Bee sting  strawberries  Current Medications: Insulin  IV fluids  steroids  bronchodilator  Home Medications: ??? albuterol sulfate (PROVENTIL;VENTOLIN) 2.5 mg/0.5 mL Nebu 2.5 mg by Nebulization route as needed.      ??? albuterol (PROVENTIL, VENTOLIN) 90 mcg/Actuation inhaler Take 2 Puffs by inhalation every six (6) hours as needed.      ??? beclomethasone (QVAR) 40 mcg/Actuation inhaler Take 1 Puff by inhalation two (2) times a day.      ??? loratadine (CLARITIN) 10 mg tablet Take 10 mg by mouth daily.      ??? montelukast (SINGULAIR) 10 mg tablet Take 10 mg by mouth daily.      ??? insulin glargine (LANTUS) 100 unit/mL injection 50 Units by SubCUTAneous route once. Daily in AM       ??? insulin (NOVOLIN 70/30) 100 unit/mL (70-30) injection by SubCUTAneous route once. Sliding scale, with meals and at bedtime    **Vital Signs**  Temperature F: 98.4  Temperature C: 36.9  Blood Pressure (mmHg): 133/73  Heart Rate (bpm): 117  Vitals Entry Time: 04/26/2018 06:32:35 AM    **Exam**  Exam: Gen  - morbidly obese female. No acute distress.  HEENT  - NCAT, EOMI, sclera non icteric.  Neck - supple,  trachea midline  Pulm  - coarse upper airway breath sounds.  No wheezing noted.  CV - tachycardia, normal S1, S2. No m/r/g noted  Abd - + BS, soft, non tender, non distended.  Ext - non edematous  Skin - warm, dry  Neuro - CN II - XII grossly intact. Speech clear.  Psych - alert and oriented x 3. Cooperative with exam.    **Labs and Diagnostic Studies**  Labs: Significant for BS in 500s. K, Mg low  Lactic acid - 2.5  DiagnosticStudies: CT chest negative for PE  UA negative for UTI    **Quality Measures**  GI Prophylaxis Ordered: No  DVT Prophylaxis Ordered: Yes  Delirium: Not Present  Is there a problem with skin integrity?: No  Central Line: Not Present  Nutritional Consult Needed?: No    **Attestation**  Interaction Mode: Video &  Phone  Phone Duration (mins): 6  Time of Call : 04/26/2018 05:51 AM  Video Duration (mins): 10  Time of Video : 04/26/2018 06:10 AM  Interaction Attestation: Clinical telemedicine services delivered using HIPAA-compliant interactive video-audio telecommunications while the patient and the rendering provider were not in the same physical location. Written report was provided to the requesting provider.  Evaluation Duration (mins): 35

## 2018-04-27 LAB — POCT GLUCOSE: POC Glucose: 340 mg/dL — ABNORMAL HIGH (ref 65–100)

## 2018-04-27 LAB — LACTIC ACID
Lactic Acid: 1.6 MMOL/L (ref 0.4–2.0)
Lactic acid: 1.6 MMOL/L (ref 0.4–2.0)

## 2018-04-27 LAB — GLUCOSE, POC: Glucose (POC): 340 mg/dL — ABNORMAL HIGH (ref 65–100)

## 2018-04-27 MED FILL — IPRATROPIUM-ALBUTEROL 2.5 MG-0.5 MG/3 ML NEB SOLUTION: 2.5 mg-0.5 mg/3 ml | RESPIRATORY_TRACT | Qty: 3

## 2018-04-27 MED FILL — INSULIN LISPRO 100 UNIT/ML INJECTION: 100 unit/mL | SUBCUTANEOUS | Qty: 1

## 2018-04-27 MED FILL — INSULIN GLARGINE 100 UNIT/ML INJECTION: 100 unit/mL | SUBCUTANEOUS | Qty: 1

## 2018-04-27 MED FILL — MUCINEX 600 MG TABLET, EXTENDED RELEASE: 600 mg | ORAL | Qty: 1

## 2018-04-27 MED FILL — BD POSIFLUSH NORMAL SALINE 0.9 % INJECTION SYRINGE: INTRAMUSCULAR | Qty: 10

## 2018-04-27 NOTE — Telephone Encounter (Signed)
CM called patient at telephone number [phone] for the purpose of follow up to her inpatient admission.  Call was answered with a [employer] message which stated "number has calling restrictions that have prevented the completion of your call".

## 2018-05-01 LAB — CULTURE, BLOOD 1
Culture: NO GROWTH
Culture: NO GROWTH

## 2018-05-01 LAB — CULTURE, BLOOD
Culture result:: NO GROWTH
Culture result:: NO GROWTH

## 2021-05-08 DEATH — deceased
